# Patient Record
Sex: Male | Born: 1965 | Hispanic: Yes | Marital: Single | Smoking: Former smoker
Health system: Southern US, Community
[De-identification: ages and names within clinical notes are randomized; demographics above are authoritative.]

## PROBLEM LIST (undated history)

## (undated) DIAGNOSIS — I10 Essential (primary) hypertension: Secondary | ICD-10-CM

## (undated) DIAGNOSIS — D689 Coagulation defect, unspecified: Secondary | ICD-10-CM

## (undated) DIAGNOSIS — E785 Hyperlipidemia, unspecified: Secondary | ICD-10-CM

## (undated) HISTORY — DX: Hyperlipidemia, unspecified: E78.5

## (undated) HISTORY — DX: Essential (primary) hypertension: I10

## (undated) HISTORY — PX: GALLBLADDER SURGERY: SHX652

## (undated) HISTORY — DX: Coagulation defect, unspecified: D68.9

---

## 2007-04-11 ENCOUNTER — Emergency Department: Payer: Self-pay | Admitting: Emergency Medicine

## 2018-10-09 ENCOUNTER — Telehealth: Payer: Self-pay | Admitting: Student

## 2018-10-09 ENCOUNTER — Ambulatory Visit
Admission: EM | Admit: 2018-10-09 | Discharge: 2018-10-09 | Disposition: A | Discharge status: Home / Self Care | Payer: 59 | Attending: Family Medicine | Admitting: Family Medicine

## 2018-10-09 DIAGNOSIS — S0502XA Injury of conjunctiva and corneal abrasion without foreign body, left eye, initial encounter: Secondary | ICD-10-CM | POA: Diagnosis not present

## 2018-10-09 MED ORDER — TETRACAINE HCL 0.5 % OP SOLN: OPHTHALMIC | 0 refills | Status: DC

## 2018-10-09 MED ORDER — KETOROLAC TROMETHAMINE 0.5 % OP SOLN: 1.0000 [drp] | Freq: Four times a day (QID) | OPHTHALMIC | 0 refills | Status: DC

## 2018-10-09 MED ORDER — CYCLOPENTOLATE HCL 1 % OP SOLN: 2.0000 [drp] | Freq: Two times a day (BID) | OPHTHALMIC | 0 refills | Status: DC

## 2018-10-09 NOTE — ED Triage Notes (Addendum)
Pt states he was outside yesterday and felt something blow in his eye. Thought it was dirt but did not go away. Unable to open his left eye because of the pain.

## 2018-10-09 NOTE — ED Provider Notes (Signed)
[facility]    CSN: [id number] Arrival date & time: 10/09/18  1549     History   Chief Complaint Chief Complaint  Patient presents with  . Eye Problem    HPI Jesse Chapman is a 53 y.o. male who presents today for evaluation of left eye pain.  The patient states that he was outside yesterday with someone who was smoking and felt like something blew into his left eye.  He states that he does not work with metal at his job.  He denies any previous injury or trauma to the left eye in his past.  No surgical history to the left eye.  He states that he is unable to open his eye due to pain and feels that light is very disruptive for his left eye.  He reports that the pain did start yesterday however it has worsened as the day has gone.  Presents today for further evaluation.  HPI  History reviewed. No pertinent past medical history.  There are no active problems to display for this patient.   Past Surgical History:  Procedure Laterality Date  . GALLBLADDER SURGERY         Home Medications    Prior to Admission medications   Medication Sig Start Date End Date Taking? Authorizing Provider  cyclopentolate (CYCLODRYL,CYCLOGYL) 1 % ophthalmic solution Place 2 drops into the left eye 2 (two) times daily. 10/09/18   Anson Oregon, PA-C  tetracaine (PONTOCAINE) 0.5 % ophthalmic solution Use 1-2 drops in the left eye as needed for pain.  Use only for the next 24 hours. 10/09/18   Anson Oregon, PA-C    Family History Family History  Problem Relation Age of Onset  . Healthy Mother   . Healthy Father     Social History Social History   Tobacco Use  . Smoking status: Former Games developer  . Smokeless tobacco: Never Used  Substance Use Topics  . Alcohol use: Never    Frequency: Never  . Drug use: Not on file     Allergies   Patient has no known allergies.   Review of Systems Review of Systems  Eyes: Positive for photophobia, pain, redness and visual  disturbance.  All other systems reviewed and are negative.  Physical Exam Triage Vital Signs ED Triage Vitals  Enc Vitals Group     BP 10/09/18 1617 (!) 186/98     Pulse Rate 10/09/18 1616 80     Resp 10/09/18 1616 18     Temp 10/09/18 1616 98.4 F (36.9 C)     Temp Source 10/09/18 1616 Oral     SpO2 10/09/18 1616 99 %     Weight 10/09/18 1618 182 lb (82.6 kg)     Height --      Head Circumference --      Peak Flow --      Pain Score 10/09/18 1618 0     Pain Loc --      Pain Edu? --      Excl. in [place]? --    No data found.  Updated Vital Signs BP (!) 186/98   Pulse 80   Temp 98.4 F (36.9 C) (Oral)   Resp 18   Wt 182 lb (82.6 kg)   SpO2 99%   Visual Acuity was unable to be obtained due to photophobia.  Physical Exam Constitutional:      General: He is not in acute distress.    Appearance: He is not  ill-appearing, toxic-appearing or diaphoretic.  HENT:     Head: Normocephalic and atraumatic.  Eyes:     General: Lids are everted, no foreign bodies appreciated.        Left eye: No foreign body, discharge or hordeolum.     Extraocular Movements:     Left eye: Normal extraocular motion.     Conjunctiva/sclera:     Left eye: Left conjunctiva is injected. No exudate or hemorrhage.     Comments: On Woods lamp exam a corneal abrasion is visualized directly over the cornea.  It appears to measure 1 mm in diameter.  No foreign body can be visualized on exam.  Neurological:     Mental Status: He is alert.     UC Treatments / Results  Labs (all labs ordered are listed, but only abnormal results are displayed) Labs Reviewed - No data to display  EKG None  Radiology No results found.  Procedures Procedures (including critical care time)  Medications Ordered in UC Medications - No data to display  Initial Impression / Assessment and Plan / UC Course  I have reviewed the triage vital signs and the nursing notes.  Pertinent labs & imaging results that were  available during my care of the patient were reviewed by me and considered in my medical decision making (see chart for details).     1.  Treatment options were discussed today with the patient. 2.  A prescription of tetracaine was given to the patient to use over the next 24 hours.  After this a prescription for Cyclodryl was provided for long term pain relief.  Ibuprofen as needed for pain.  Encouraged using a patch for the rest of the day today. 3.  Follow-up as needed if pain continues or fails to worsen. Final Clinical Impressions(s) / UC Diagnoses   Final diagnoses:  Abrasion of left cornea, initial encounter     Discharge Instructions     -Use patch for the left eye over the next 24 hours. -I have prescribed two types of eye drops for you, one is Tetracaine which is for pain, use only over the next day.  The other is called Cyclodryl which you can use over the next week as needed for pain. -Follow-up with no improvement.   ED Prescriptions    Medication Sig Dispense Auth. Provider   cyclopentolate (CYCLODRYL,CYCLOGYL) 1 % ophthalmic solution Place 2 drops into the left eye 2 (two) times daily. 15 mL Anson Oregon, PA-C   tetracaine (PONTOCAINE) 0.5 % ophthalmic solution Use 1-2 drops in the left eye as needed for pain.  Use only for the next 24 hours. 5 mL Anson Oregon, PA-C     Controlled Substance Prescriptions [place] Controlled Substance Registry consulted? Not Applicable   Anson Oregon, PA-C 10/09/18 1646

## 2018-10-09 NOTE — Discharge Instructions (Signed)
-  Use patch for the left eye over the next 24 hours. -I have prescribed two types of eye drops for you, one is Tetracaine which is for pain, use only over the next day.  The other is called Cyclodryl which you can use over the next week as needed for pain. -Follow-up with no improvement.

## 2018-10-09 NOTE — Telephone Encounter (Signed)
Sent in Toradol eye drops for pain control for corneal abrasion present to the patient's left eye.  Valeria Batman, PA-C [facility]

## 2019-03-19 ENCOUNTER — Encounter: Payer: Self-pay | Admitting: Emergency Medicine

## 2019-03-19 ENCOUNTER — Ambulatory Visit
Admission: EM | Admit: 2019-03-19 | Discharge: 2019-03-19 | Disposition: A | Discharge status: Home / Self Care | Payer: 59 | Attending: Urgent Care | Admitting: Urgent Care

## 2019-03-19 ENCOUNTER — Other Ambulatory Visit: Payer: Self-pay

## 2019-03-19 DIAGNOSIS — H60502 Unspecified acute noninfective otitis externa, left ear: Secondary | ICD-10-CM

## 2019-03-19 DIAGNOSIS — H6122 Impacted cerumen, left ear: Secondary | ICD-10-CM

## 2019-03-19 DIAGNOSIS — T162XXA Foreign body in left ear, initial encounter: Secondary | ICD-10-CM

## 2019-03-19 MED ORDER — NEOMYCIN-POLYMYXIN-HC 3.5-10000-1 OT SUSP: 4.0000 [drp] | Freq: Three times a day (TID) | OTIC | 0 refills | Status: DC

## 2019-03-19 NOTE — Discharge Instructions (Signed)
It was very nice seeing you today in clinic. Thank you for entrusting me with your care.   Keep ears clean and dry. Avoid using Q-tips. Please utilize the ear drops that we discussed for the next week. Your prescriptions has been called in to your pharmacy.   Make arrangements to follow up with your regular doctor in 1 week for re-evaluation if not improving. If your symptoms/condition worsens, please seek follow up care either here or in the ER. Please remember, our [place] providers are "right here with you" when you need [place].   Again, it was my pleasure to take care of you today. Thank you for choosing our clinic. I hope that you start to feel better quickly.   Honor Loh, MSN, APRN, FNP-C, CEN Advanced Practice Provider [facility]

## 2019-03-19 NOTE — ED Provider Notes (Signed)
[place] [place]   Name: Jesse Chapman DOB: [birth year] MRN: [id number] CSN: [id number] PCP: Patient, No Pcp Per  Arrival date and time:  03/19/19 1536  Chief Complaint:  Otalgia (left)   NOTE: Prior to seeing the patient today, I have reviewed the triage nursing documentation and vital signs. Clinical staff has updated patient's PMH/PSHx, current medication list, and drug allergies/intolerances to ensure comprehensive history available to assist in medical decision making.   History:   HPI: Jesse Chapman is a 53 y.o. male who presents today with complaints of pain in his LEFT ear. Pain began with acute onset last night. Patient has not had any other recent upper respiratory symptoms; no cough, congestion, rhinorrhea, sneezing, or sore throat. He denies forceful nose blowing. Patient reports that he was cleaning his ears out with a cotton tipped swab when the pain began. This morning, he appreciated dried blood on his pillow and external ear. He advises that his ability to hear from the LEFT ear has acutely changed with the onset of the pain; describes hearing as being muffled. Patient denies history of recurrent ear infections. He has never had tympanostomy tubes in the past. Patient advising that he has not been swimming in the recent past.   History reviewed. No pertinent past medical history.  Past Surgical History:  Procedure Laterality Date  . GALLBLADDER SURGERY      Family History  Problem Relation Age of Onset  . Healthy Mother   . Healthy Father     Social History   Tobacco Use  . Smoking status: Former Games developer  . Smokeless tobacco: Never Used  Substance Use Topics  . Alcohol use: Never    Frequency: Never  . Drug use: Not on file    There are no active problems to display for this patient.   Home Medications:    No outpatient medications have been marked as taking for the 03/19/19 encounter [facility] Encounter).    Allergies:   Patient has no  known allergies.  Review of Systems (ROS): Review of Systems  Constitutional: Negative for chills and fever.  HENT: Positive for ear discharge and ear pain. Negative for congestion, facial swelling, rhinorrhea, sinus pressure, sinus pain and sore throat.   Respiratory: Negative for cough and shortness of breath.   Cardiovascular: Negative for chest pain and palpitations.  Neurological: Negative for dizziness and headaches.  All other systems reviewed and are negative.    Vital Signs: Today's Vitals   03/19/19 1546 03/19/19 1549 03/19/19 1632  BP:  (!) 166/95   Pulse:  85   Resp:  16   Temp:  98.1 F (36.7 C)   TempSrc:  Oral   SpO2:  98%   Weight: 178 lb (80.7 kg)    PainSc: 6   6     Physical Exam: Physical Exam  Constitutional: He is oriented to person, place, and time and well-developed, well-nourished, and in no distress.  HENT:  Head: Normocephalic and atraumatic.  Right Ear: Hearing, tympanic membrane, external ear and ear canal normal.  Left Ear: There is drainage (sanguinous) and tenderness. Decreased hearing (muffled) is noted.  Mouth/Throat: Mucous membranes are normal.  LEFT ear reveals a EAC that is filled with impacted cerumen and portions of retained cotton. TM obscured (pre-procedural).   Eyes: Pupils are equal, round, and reactive to light. EOM are normal.  Neck: Normal range of motion. Neck supple. No tracheal deviation present.  Cardiovascular: Normal rate, regular rhythm, normal heart  sounds and intact distal pulses. Exam reveals no gallop and no friction rub.  No murmur heard. Pulmonary/Chest: Effort normal and breath sounds normal. No respiratory distress. He has no wheezes. He has no rales.  Neurological: He is alert and oriented to person, place, and time. Gait normal.  Skin: Skin is warm and dry. No rash noted.  Psychiatric: Mood, memory, affect and judgment normal.  Nursing note and vitals reviewed.   Urgent Care Treatments / Results:    LABS: PLEASE NOTE: all labs that were ordered this encounter are listed, however only abnormal results are displayed. Labs Reviewed - No data to display  EKG: -None  RADIOLOGY: No results found.  PROCEDURES: Ear Cerumen Removal Performed by: Karen Kitchens, NP Authorized by: Karen Kitchens, NP   Consent:    Consent obtained:  Verbal   Consent given by:  Patient   Risks discussed:  Bleeding, dizziness, infection, incomplete removal, pain and TM perforation   Alternatives discussed:  Delayed treatment, alternative treatment and referral Procedure details:    Location:  L ear   Procedure type comment:  Warm water lavage followed by curette disimpaction Post-procedure details:    Inspection:  Bleeding, macerated skin and TM intact   Hearing quality:  Improved   Patient tolerance of procedure:  Tolerated well, no immediate complications   MEDICATIONS RECEIVED THIS VISIT: Medications - No data to display  PERTINENT CLINICAL COURSE NOTES/UPDATES:   Initial Impression / Assessment and Plan / Urgent Care Course:  Pertinent labs & imaging results that were available during my care of the patient were personally reviewed by me and considered in my medical decision making (see lab/imaging section of note for values and interpretations).  Jesse Chapman is a 53 y.o. male who presents to [facility] today with complaints of Otalgia (left)   Patient is well appearing overall in clinic today. He does not appear to be in any acute distress. Presenting symptoms (see HPI) and exam as documented above. LEFT ear reveals EAC totally occluded by cerumen and portions of cotton. Copious amounts of cerumen removed. Large wad of cotton also removed. Patient does not recall cotton being left in ear. Post-procedural inspection reveals significantly macerated EAC with a fair amount of bleeding. TM intact. Will cover with a 5 day course of Cortisporin gtts. May use Tylenol and/or Ibuprofen  as needed for discomfort. Discussed routine ear care, including the need to avoid the use of cotton tipped swabs.   Discussed follow up with primary care physician in 1 week for re-evaluation. I have reviewed the follow up and strict return precautions for any new or worsening symptoms. Patient is aware of symptoms that would be deemed urgent/emergent, and would thus require further evaluation either here or in the emergency department. At the time of discharge, he verbalized understanding and consent with the discharge plan as it was reviewed with him. All questions were fielded by provider and/or clinic staff prior to patient discharge.    Final Clinical Impressions / Urgent Care Diagnoses:   Final diagnoses:  Acute otitis externa of left ear, unspecified type  Impacted cerumen of left ear  FB ear, left, initial encounter    New Prescriptions:  [place] Controlled Substance Registry consulted? Not Applicable  Meds ordered this encounter  Medications  . neomycin-polymyxin-hydrocortisone (CORTISPORIN) 3.5-10000-1 OTIC suspension    Sig: Place 4 drops into the left ear 3 (three) times daily. for the next 5 days    Dispense:  10 mL  Refill:  0    Recommended Follow up Care:  Patient encouraged to follow up with the following provider within the specified time frame, or sooner as dictated by the severity of his symptoms. As always, he was instructed that for any urgent/emergent care needs, he should seek care either here or in the emergency department for more immediate evaluation.  Follow-up Information    PCP In 1 week.   Why: General reassessment of symptoms if not improving        NOTE: This note was prepared using Scientist, clinical (histocompatibility and immunogenetics)Dragon dictation software along with smaller Lobbyistphrase technology. Despite my best ability to proofread, there is the potential that transcriptional errors may still occur from this process, and are completely unintentional.     Verlee MonteGray, Lamine Laton E, NP 03/20/19 0015

## 2019-03-19 NOTE — ED Triage Notes (Signed)
Patient c/o left ear pain that started last night. Patient report some blood coming from his left ear this morning. Patient denies fevers.

## 2019-08-31 ENCOUNTER — Emergency Department
Admission: EM | Admit: 2019-08-31 | Discharge: 2019-08-31 | Disposition: A | Discharge status: Home / Self Care | Payer: 59 | Attending: Emergency Medicine | Admitting: Emergency Medicine

## 2019-08-31 ENCOUNTER — Encounter: Payer: Self-pay | Admitting: Emergency Medicine

## 2019-08-31 ENCOUNTER — Other Ambulatory Visit: Payer: Self-pay

## 2019-08-31 ENCOUNTER — Emergency Department: Admission: EM | Admit: 2019-08-31 | Discharge: 2019-08-31 | Discharge status: Absent without leave | Payer: Self-pay

## 2019-08-31 ENCOUNTER — Emergency Department: Payer: 59

## 2019-08-31 DIAGNOSIS — Z87891 Personal history of nicotine dependence: Secondary | ICD-10-CM | POA: Diagnosis not present

## 2019-08-31 DIAGNOSIS — R9431 Abnormal electrocardiogram [ECG] [EKG]: Secondary | ICD-10-CM | POA: Insufficient documentation

## 2019-08-31 LAB — BASIC METABOLIC PANEL
Anion gap: 6 (ref 5–15)
BUN: 18 mg/dL (ref 6–20)
CO2: 29 mmol/L (ref 22–32)
Calcium: 9.1 mg/dL (ref 8.9–10.3)
Chloride: 102 mmol/L (ref 98–111)
Creatinine, Ser: 1.04 mg/dL (ref 0.61–1.24)
GFR calc Af Amer: >60 mL/min (ref >60)
GFR calc non Af Amer: >60 mL/min (ref >60)
Glucose, Bld: 116 mg/dL — ABNORMAL HIGH (ref 70–99)
Potassium: 3.9 mmol/L (ref 3.5–5.1)
Sodium: 137 mmol/L (ref 135–145)

## 2019-08-31 LAB — CBC
HCT: 39.8 % (ref 39.0–52.0)
Hemoglobin: 14.1 g/dL (ref 13.0–17.0)
MCH: 30.1 pg (ref 26.0–34.0)
MCHC: 35.4 g/dL (ref 30.0–36.0)
MCV: 85 fL (ref 80.0–100.0)
Platelets: 395 10*3/uL (ref 150–400)
RBC: 4.68 MIL/uL (ref 4.22–5.81)
RDW: 11.4 % — ABNORMAL LOW (ref 11.5–15.5)
WBC: 14.3 10*3/uL — ABNORMAL HIGH (ref 4.0–10.5)
nRBC: 0 % (ref 0.0–0.2)

## 2019-08-31 LAB — TROPONIN I (HIGH SENSITIVITY): Troponin I (High Sensitivity): 6 ng/L (ref <18)

## 2019-08-31 NOTE — ED Provider Notes (Signed)
[facility] Emergency Department Provider Note  Time seen: 9:56 PM  I have reviewed the triage vital signs and the nursing notes.   HISTORY  Chief Complaint Abnormal EKG   HPI Jesse Chapman is a 54 y.o. male with no significant past medical history presents to the emergency department for an abnormal EKG.  According to the patient around 1230 this afternoon he took a marijuana gummy, he states approximately 20 to 30 minutes later he began feeling very unwell which she describes as nausea tingling all over his body and shaking.  Patient states he ultimately went to the walk-in clinic was given a little pink pill  and states 10 minutes after taking the pink pill he felt completely back to normal.  They told him however his EKG was abnormal and he should go to the emergency department.  Patient denies any chest pain or shortness of breath at any point.  Patient states he feels normal.  Largely negative review of systems.  History reviewed. No pertinent past medical history.  There are no problems to display for this patient.   Past Surgical History:  Procedure Laterality Date  . GALLBLADDER SURGERY      Prior to Admission medications   Medication Sig Start Date End Date Taking? Authorizing Provider  neomycin-polymyxin-hydrocortisone (CORTISPORIN) 3.5-10000-1 OTIC suspension Place 4 drops into the left ear 3 (three) times daily. for the next 5 days 03/19/19   Karen Kitchens, NP    No Known Allergies  Family History  Problem Relation Age of Onset  . Healthy Mother   . Healthy Father     Social History Social History   Tobacco Use  . Smoking status: Former Research scientist (life sciences)  . Smokeless tobacco: Never Used  Substance Use Topics  . Alcohol use: Never  . Drug use: Never    Review of Systems Constitutional: Negative for fever. Cardiovascular: Negative for chest pain. Respiratory: Negative for shortness of breath. Gastrointestinal: Negative for abdominal  pain Musculoskeletal: Negative for musculoskeletal complaints Neurological: Negative for headache All other ROS negative  ____________________________________________   PHYSICAL EXAM:  VITAL SIGNS: ED Triage Vitals  Enc Vitals Group     BP 08/31/19 1854 (!) 143/88     Pulse Rate 08/31/19 1854 90     Resp 08/31/19 1854 16     Temp 08/31/19 1854 98.4 F (36.9 C)     Temp Source 08/31/19 1854 Oral     SpO2 08/31/19 1854 97 %     Weight 08/31/19 1836 180 lb (81.6 kg)     Height 08/31/19 1836 5\' 4"  (1.626 m)     Head Circumference --      Peak Flow --      Pain Score 08/31/19 1836 0     Pain Loc --      Pain Edu? --      Excl. in [place]? --    Constitutional: Alert and oriented. Well appearing and in no distress. Eyes: Normal exam ENT      Head: Normocephalic and atraumatic.      Mouth/Throat: Mucous membranes are moist. Cardiovascular: Normal rate, regular rhythm.  Respiratory: Normal respiratory effort without tachypnea nor retractions. Breath sounds are clear Gastrointestinal: Soft and nontender. No distention. Musculoskeletal: Nontender with normal range of motion in all extremities.  Neurologic:  Normal speech and language. No gross focal neurologic deficits  Skin:  Skin is warm, dry and intact.  Psychiatric: Mood and affect are normal.  ____________________________________________  EKG  EKG viewed and interpreted by myself shows a normal sinus rhythm at 95 bpm with a narrow QRS, normal axis, normal intervals.  Patient does have inferior T wave inversions.  No old EKG for comparison. ____________________________________________    RADIOLOGY  Chest x-ray negative  ____________________________________________   INITIAL IMPRESSION / ASSESSMENT AND PLAN / ED COURSE  Pertinent labs & imaging results that were available during my care of the patient were reviewed by me and considered in my medical decision making (see chart for details).   Patient presents to the  emergency department referred from walk-in clinic for an abnormal EKG.  According to the patient he ate a marijuana gummy around 12:30 PM.  20 to 30 minutes later he began feeling tingling all over his body shaky and weak.  Patient went to the walk-in clinic was given a "little pink pill" and felt much better 10 to 15 minutes later.  Patient denies any symptoms at this time but was told he needed to come here due to an abnormal EKG per patient.  Patient's EKG is abnormal with inferior T wave inversions.  No ST elevation.  Patient has no chest symptoms no pain or shortness of breath.  Patient is troponin is reassuringly negative.  No old EKG for comparison.  However as the patient feels normal has no symptoms at this time with a reassuring work-up and physical exam I believe the patient is safe for discharge home with outpatient cardiology follow-up.  Patient agreeable to this plan as well.  Discussed my normal chest pain return precautions.  Jesse Chapman was evaluated in Emergency Department on 08/31/2019 for the symptoms described in the history of present illness. He was evaluated in the context of the global COVID-19 pandemic, which necessitated consideration that the patient might be at risk for infection with the SARS-CoV-2 virus that causes COVID-19. Institutional protocols and algorithms that pertain to the evaluation of patients at risk for COVID-19 are in a state of rapid change based on information released by regulatory bodies including the CDC and federal and state organizations. These policies and algorithms were followed during the patient's care in the ED.  ____________________________________________   FINAL CLINICAL IMPRESSION(S) / ED DIAGNOSES  Abnormal EKG   Minna Antis, MD 08/31/19 2201

## 2019-08-31 NOTE — ED Triage Notes (Signed)
Pt in via POV, sent over from [facility] due to abnormal EKG.  Pt denies any chest pain, reports being at work and "feeling as if I had been drugged, I had a headache and felt a tingling sensation all over my body."  Symptoms have resolved at this time.

## 2019-08-31 NOTE — Discharge Instructions (Addendum)
Please drink plenty of fluids and obtain plenty of rest.  Please follow-up with cardiology by calling the number provided regarding your abnormal EKG.  Return to the emergency department for any chest pain, trouble breathing, or any other symptom personally concerning to yourself.

## 2020-05-17 ENCOUNTER — Other Ambulatory Visit: Payer: Self-pay

## 2020-05-17 ENCOUNTER — Ambulatory Visit
Admission: EM | Admit: 2020-05-17 | Discharge: 2020-05-17 | Disposition: A | Discharge status: Home / Self Care | Payer: 59

## 2020-05-17 DIAGNOSIS — H5712 Ocular pain, left eye: Secondary | ICD-10-CM

## 2020-05-17 NOTE — ED Provider Notes (Signed)
[facility]    CSN: [id number] Arrival date & time: 05/17/20  8101      History   Chief Complaint Chief Complaint  Patient presents with  . Eye Pain    left    HPI Jesse Chapman is a 54 y.o. male.   -year-old male here for evaluation of left eye pain, drainage, and light sensitivity.  Patient reports that he was fine when he went to bed last night but when he woke up this morning he had some discharge, felt like there was a foreign body in his eye, had some tearing.  Patient denies itching, headache, nausea, or known injury.  Patient denies that he was working with any wood or metal grinder's.  Patient reports that his vision was clear yesterday but is completely blurry in that eye today.     History reviewed. No pertinent past medical history.  There are no problems to display for this patient.   Past Surgical History:  Procedure Laterality Date  . GALLBLADDER SURGERY         Home Medications    Prior to Admission medications   Not on File    Family History Family History  Problem Relation Age of Onset  . Healthy Mother   . Healthy Father     Social History Social History   Tobacco Use  . Smoking status: Former Games developer  . Smokeless tobacco: Never Used  Vaping Use  . Vaping Use: Former  Substance Use Topics  . Alcohol use: Never  . Drug use: Never     Allergies   Patient has no known allergies.   Review of Systems Review of Systems  Constitutional: Negative for activity change and appetite change.  HENT: Negative for rhinorrhea and sore throat.   Eyes: Positive for photophobia, pain, discharge and visual disturbance. Negative for redness and itching.  Respiratory: Negative for cough and shortness of breath.   Cardiovascular: Negative for chest pain.  Gastrointestinal: Negative for diarrhea, nausea and vomiting.  Musculoskeletal: Negative for arthralgias and myalgias.  Skin: Negative for rash.  Neurological: Negative  for syncope and headaches.  Hematological: Negative.   Psychiatric/Behavioral: Negative.      Physical Exam Triage Vital Signs ED Triage Vitals  Enc Vitals Group     BP 05/17/20 1000 (!) 156/93     Pulse Rate 05/17/20 1000 67     Resp 05/17/20 1000 18     Temp 05/17/20 1000 98.1 F (36.7 C)     Temp Source 05/17/20 1000 Oral     SpO2 05/17/20 1000 100 %     Weight 05/17/20 0958 180 lb (81.6 kg)     Height 05/17/20 0958 5\' 4"  (1.626 m)     Head Circumference --      Peak Flow --      Pain Score 05/17/20 0958 2     Pain Loc --      Pain Edu? --      Excl. in GC? --    No data found.  Updated Vital Signs BP (!) 156/93 (BP Location: Right Arm)   Pulse 67   Temp 98.1 F (36.7 C) (Oral)   Resp 18   Ht 5\' 4"  (1.626 m)   Wt 180 lb (81.6 kg)   SpO2 100%   BMI 30.90 kg/m   Visual Acuity Right Eye Distance:   Left Eye Distance:   Bilateral Distance:    Right Eye Near:   Left Eye Near:  Bilateral Near:     Physical Exam Vitals and nursing note reviewed.  Constitutional:      General: He is in acute distress.     Appearance: Normal appearance.     Comments: Patient appears to be in pain.  HENT:     Head: Normocephalic and atraumatic.  Eyes:     General: No scleral icterus.       Left eye: No discharge.     Extraocular Movements: Extraocular movements intact.     Conjunctiva/sclera: Conjunctivae normal.     Pupils: Pupils are equal, round, and reactive to light.     Comments: Patient has mild swelling to the upper and lower eyelid of the left eye.  There is no erythema or induration noted.  No discharge on the lashes or in either inner or outer canthus.  Patient's pupil is 4 mm and constrict to 2 mm with direct light.  Patient has marked photophobia.  EOM is intact.  2 drops of tetracaine instilled into the left eye followed by fluorescein dye.  I examined under Woods lamp with no evidence of corneal abrasion or foreign body.  Upper and lower eyelid inverted and  no foreign body was visualized.  Cardiovascular:     Rate and Rhythm: Normal rate and regular rhythm.     Pulses: Normal pulses.     Heart sounds: Normal heart sounds. No murmur heard.  No gallop.   Pulmonary:     Effort: Pulmonary effort is normal.     Breath sounds: Normal breath sounds. No wheezing, rhonchi or rales.  Musculoskeletal:        General: No swelling or tenderness. Normal range of motion.     Cervical back: Normal range of motion and neck supple. No tenderness.  Skin:    General: Skin is warm and dry.     Capillary Refill: Capillary refill takes less than 2 seconds.     Findings: Erythema present. No rash.  Neurological:     General: No focal deficit present.     Mental Status: He is alert and oriented to person, place, and time.     Sensory: No sensory deficit.     Motor: No weakness.  Psychiatric:        Mood and Affect: Mood normal.        Behavior: Behavior normal.        Thought Content: Thought content normal.        Judgment: Judgment normal.      UC Treatments / Results  Labs (all labs ordered are listed, but only abnormal results are displayed) Labs Reviewed - No data to display  EKG   Radiology No results found.  Procedures Procedures (including critical care time)  Medications Ordered in UC Medications - No data to display  Initial Impression / Assessment and Plan / UC Course  I have reviewed the triage vital signs and the nursing notes.  Pertinent labs & imaging results that were available during my care of the patient were reviewed by me and considered in my medical decision making (see chart for details).   Patient is complaining of foreign body sensation and pain in his left eye with associated light sensitivity.  Patient reports that he had some drainage on his lashes this morning but denies any itching.  Bulbar and labral conjunctiva do not show significant injection.  There is no evidence of foreign body or corneal abrasion under  Woods lamp examination.  No foreign body located within eversion  of upper or lower eyelid.  Patient is seen by [facility] in [place].  Called and spoke with the office regarding their ability to see the patient today and I am awaiting a call back.  [facility] can see patient in the office today at 1:40 PM.  Will patch patient's left eye and DC him to follow-up with ophthalmology.  Final Clinical Impressions(s) / UC Diagnoses   Final diagnoses:  Left eye pain     Discharge Instructions     Wear the patch on your left eye until you are evaluated by ophthalmology.  [facility] can see you this afternoon at 1:40 PM.    ED Prescriptions    None     PDMP not reviewed this encounter.   Becky Augusta, NP 05/17/20 1048

## 2020-05-17 NOTE — ED Notes (Signed)
Placed 4x4 over eye with tape applied to keep eye closed til he follows up with [facility].

## 2020-05-17 NOTE — ED Triage Notes (Signed)
Patient states that he has been having left eye pain since this morning. States that he feels like something is in his and is unable to open fully. Patient states that something feels like it is poking him. Sensitive to light and draining some.

## 2020-05-17 NOTE — Discharge Instructions (Addendum)
Wear the patch on your left eye until you are evaluated by ophthalmology.  [facility] can see you this afternoon at 1:40 PM.

## 2020-07-25 ENCOUNTER — Ambulatory Visit
Admission: EM | Admit: 2020-07-25 | Discharge: 2020-07-25 | Disposition: A | Discharge status: Home / Self Care | Payer: 59 | Attending: Family Medicine | Admitting: Family Medicine

## 2020-07-25 ENCOUNTER — Encounter: Payer: Self-pay | Admitting: Radiology

## 2020-07-25 ENCOUNTER — Inpatient Hospital Stay
Admission: EM | Admit: 2020-07-25 | Discharge: 2020-07-28 | DRG: 253 | Disposition: A | Discharge status: Home / Self Care | Payer: 59 | Attending: Internal Medicine | Admitting: Internal Medicine

## 2020-07-25 ENCOUNTER — Other Ambulatory Visit (INDEPENDENT_AMBULATORY_CARE_PROVIDER_SITE_OTHER): Payer: Self-pay | Admitting: Vascular Surgery

## 2020-07-25 ENCOUNTER — Other Ambulatory Visit: Payer: Self-pay

## 2020-07-25 ENCOUNTER — Emergency Department: Payer: 59

## 2020-07-25 ENCOUNTER — Encounter
Admission: EM | Disposition: A | Discharge status: Home / Self Care | Payer: Self-pay | Source: Home / Self Care | Attending: Internal Medicine

## 2020-07-25 DIAGNOSIS — R93421 Abnormal radiologic findings on diagnostic imaging of right kidney: Secondary | ICD-10-CM | POA: Diagnosis present

## 2020-07-25 DIAGNOSIS — R231 Pallor: Secondary | ICD-10-CM | POA: Diagnosis not present

## 2020-07-25 DIAGNOSIS — I742 Embolism and thrombosis of arteries of the upper extremities: Principal | ICD-10-CM | POA: Diagnosis present

## 2020-07-25 DIAGNOSIS — Z20822 Contact with and (suspected) exposure to covid-19: Secondary | ICD-10-CM | POA: Diagnosis present

## 2020-07-25 DIAGNOSIS — R100 Acute abdomen: Secondary | ICD-10-CM | POA: Diagnosis present

## 2020-07-25 DIAGNOSIS — Z821 Family history of blindness and visual loss: Secondary | ICD-10-CM

## 2020-07-25 DIAGNOSIS — Z23 Encounter for immunization: Secondary | ICD-10-CM

## 2020-07-25 DIAGNOSIS — I998 Other disorder of circulatory system: Secondary | ICD-10-CM

## 2020-07-25 DIAGNOSIS — Z87891 Personal history of nicotine dependence: Secondary | ICD-10-CM

## 2020-07-25 DIAGNOSIS — Z6837 Body mass index (BMI) 37.0-37.9, adult: Secondary | ICD-10-CM | POA: Diagnosis not present

## 2020-07-25 DIAGNOSIS — M79601 Pain in right arm: Secondary | ICD-10-CM | POA: Diagnosis not present

## 2020-07-25 DIAGNOSIS — K56609 Unspecified intestinal obstruction, unspecified as to partial versus complete obstruction: Secondary | ICD-10-CM | POA: Diagnosis not present

## 2020-07-25 DIAGNOSIS — Z818 Family history of other mental and behavioral disorders: Secondary | ICD-10-CM

## 2020-07-25 DIAGNOSIS — K56601 Complete intestinal obstruction, unspecified as to cause: Secondary | ICD-10-CM | POA: Diagnosis not present

## 2020-07-25 DIAGNOSIS — I7090 Unspecified atherosclerosis: Secondary | ICD-10-CM

## 2020-07-25 DIAGNOSIS — Z8249 Family history of ischemic heart disease and other diseases of the circulatory system: Secondary | ICD-10-CM | POA: Diagnosis not present

## 2020-07-25 DIAGNOSIS — D72829 Elevated white blood cell count, unspecified: Secondary | ICD-10-CM | POA: Diagnosis not present

## 2020-07-25 DIAGNOSIS — R202 Paresthesia of skin: Secondary | ICD-10-CM | POA: Diagnosis not present

## 2020-07-25 DIAGNOSIS — Z825 Family history of asthma and other chronic lower respiratory diseases: Secondary | ICD-10-CM

## 2020-07-25 DIAGNOSIS — R200 Anesthesia of skin: Secondary | ICD-10-CM | POA: Diagnosis present

## 2020-07-25 DIAGNOSIS — I10 Essential (primary) hypertension: Secondary | ICD-10-CM | POA: Diagnosis present

## 2020-07-25 DIAGNOSIS — E669 Obesity, unspecified: Secondary | ICD-10-CM | POA: Diagnosis present

## 2020-07-25 HISTORY — PX: UPPER EXTREMITY ANGIOGRAPHY: CATH118270

## 2020-07-25 LAB — RESP PANEL BY RT-PCR (FLU A&B, COVID) ARPGX2
Influenza A by PCR: NEGATIVE
Influenza B by PCR: NEGATIVE
SARS Coronavirus 2 by RT PCR: NEGATIVE

## 2020-07-25 LAB — CBC WITH DIFFERENTIAL/PLATELET
Abs Immature Granulocytes: 0.09 10*3/uL — ABNORMAL HIGH (ref 0.00–0.07)
Basophils Absolute: 0 10*3/uL (ref 0.0–0.1)
Basophils Relative: 0 %
Eosinophils Absolute: 0 10*3/uL (ref 0.0–0.5)
Eosinophils Relative: 0 %
HCT: 44.5 % (ref 39.0–52.0)
Hemoglobin: 15.7 g/dL (ref 13.0–17.0)
Immature Granulocytes: 1 %
Lymphocytes Relative: 6 %
Lymphs Abs: 1.1 10*3/uL (ref 0.7–4.0)
MCH: 30.3 pg (ref 26.0–34.0)
MCHC: 35.3 g/dL (ref 30.0–36.0)
MCV: 85.7 fL (ref 80.0–100.0)
Monocytes Absolute: 0.5 10*3/uL (ref 0.1–1.0)
Monocytes Relative: 3 %
Neutro Abs: 16.4 10*3/uL — ABNORMAL HIGH (ref 1.7–7.7)
Neutrophils Relative %: 90 %
Platelets: 363 10*3/uL (ref 150–400)
RBC: 5.19 MIL/uL (ref 4.22–5.81)
RDW: 11.5 % (ref 11.5–15.5)
WBC: 18.1 10*3/uL — ABNORMAL HIGH (ref 4.0–10.5)
nRBC: 0 % (ref 0.0–0.2)

## 2020-07-25 LAB — COMPREHENSIVE METABOLIC PANEL
ALT: 41 U/L (ref 0–44)
AST: 29 U/L (ref 15–41)
Albumin: 4.5 g/dL (ref 3.5–5.0)
Alkaline Phosphatase: 58 U/L (ref 38–126)
Anion gap: 14 (ref 5–15)
BUN: 23 mg/dL — ABNORMAL HIGH (ref 6–20)
CO2: 24 mmol/L (ref 22–32)
Calcium: 9.4 mg/dL (ref 8.9–10.3)
Chloride: 101 mmol/L (ref 98–111)
Creatinine, Ser: 1.07 mg/dL (ref 0.61–1.24)
GFR, Estimated: >60 mL/min (ref >60)
Glucose, Bld: 162 mg/dL — ABNORMAL HIGH (ref 70–99)
Potassium: 4 mmol/L (ref 3.5–5.1)
Sodium: 139 mmol/L (ref 135–145)
Total Bilirubin: 1 mg/dL (ref 0.3–1.2)
Total Protein: 7.7 g/dL (ref 6.5–8.1)

## 2020-07-25 LAB — LACTIC ACID, PLASMA: Lactic Acid, Venous: 1.9 mmol/L (ref 0.5–1.9)

## 2020-07-25 LAB — HEPARIN LEVEL (UNFRACTIONATED): Heparin Unfractionated: 0.67 IU/mL (ref 0.30–0.70)

## 2020-07-25 LAB — TYPE AND SCREEN

## 2020-07-25 LAB — HIV ANTIBODY (ROUTINE TESTING W REFLEX): HIV Screen 4th Generation wRfx: NONREACTIVE

## 2020-07-25 LAB — APTT: aPTT: 25 seconds (ref 24–36)

## 2020-07-25 SURGERY — UPPER EXTREMITY ANGIOGRAPHY
Anesthesia: Moderate Sedation | Laterality: Right

## 2020-07-25 MED ORDER — LABETALOL HCL 5 MG/ML IV SOLN
INTRAVENOUS | Status: AC
  Filled 2020-07-25: qty 4

## 2020-07-25 MED ORDER — SODIUM CHLORIDE 0.9 % IV SOLN: 250.0000 mL | INTRAVENOUS | Status: DC | PRN

## 2020-07-25 MED ORDER — CEFAZOLIN SODIUM-DEXTROSE 2-4 GM/100ML-% IV SOLN: 2.0000 g | Freq: Once | INTRAVENOUS | Status: DC

## 2020-07-25 MED ORDER — LACTATED RINGERS IV BOLUS
1000.0000 mL | Freq: Once | INTRAVENOUS | Status: AC
  Administered 2020-07-25: 1000 mL via INTRAVENOUS

## 2020-07-25 MED ORDER — ONDANSETRON HCL 4 MG/2ML IJ SOLN: 4.0000 mg | Freq: Four times a day (QID) | INTRAMUSCULAR | Status: DC | PRN

## 2020-07-25 MED ORDER — HEPARIN (PORCINE) 25000 UT/250ML-% IV SOLN
1400.0000 [IU]/h | INTRAVENOUS | Status: DC
  Administered 2020-07-25 – 2020-07-26 (×3): 1400 [IU]/h via INTRAVENOUS
  Filled 2020-07-25 (×3): qty 250

## 2020-07-25 MED ORDER — MORPHINE SULFATE (PF) 2 MG/ML IV SOLN
INTRAVENOUS | Status: AC
  Filled 2020-07-25: qty 1

## 2020-07-25 MED ORDER — SODIUM CHLORIDE 0.9 % IV SOLN: INTRAVENOUS | Status: AC

## 2020-07-25 MED ORDER — HYDROMORPHONE HCL 1 MG/ML IJ SOLN
1.0000 mg | Freq: Once | INTRAMUSCULAR | Status: AC | PRN
  Administered 2020-07-25: 1 mg via INTRAVENOUS
  Filled 2020-07-25: qty 1

## 2020-07-25 MED ORDER — SODIUM CHLORIDE 0.9 % IV SOLN: Freq: Once | INTRAVENOUS | Status: DC

## 2020-07-25 MED ORDER — ACETAMINOPHEN 325 MG PO TABS: 650.0000 mg | ORAL_TABLET | Freq: Four times a day (QID) | ORAL | Status: DC | PRN

## 2020-07-25 MED ORDER — FENTANYL CITRATE (PF) 100 MCG/2ML IJ SOLN
INTRAMUSCULAR | Status: AC
  Filled 2020-07-25: qty 2

## 2020-07-25 MED ORDER — FENTANYL CITRATE (PF) 100 MCG/2ML IJ SOLN: 12.5000 ug | INTRAMUSCULAR | Status: DC | PRN

## 2020-07-25 MED ORDER — IODIXANOL 320 MG/ML IV SOLN
INTRAVENOUS | Status: DC | PRN
  Administered 2020-07-25: 75 mL

## 2020-07-25 MED ORDER — FENTANYL CITRATE (PF) 100 MCG/2ML IJ SOLN
INTRAMUSCULAR | Status: AC
  Administered 2020-07-25: 50 ug
  Filled 2020-07-25: qty 2

## 2020-07-25 MED ORDER — ATORVASTATIN CALCIUM 10 MG PO TABS
10.0000 mg | ORAL_TABLET | Freq: Every day | ORAL | Status: DC
  Administered 2020-07-27: 10 mg via ORAL
  Filled 2020-07-25 (×3): qty 1

## 2020-07-25 MED ORDER — MIDAZOLAM HCL 5 MG/5ML IJ SOLN
INTRAMUSCULAR | Status: AC
  Administered 2020-07-25: 2 mg
  Filled 2020-07-25: qty 5

## 2020-07-25 MED ORDER — BUTAMBEN-TETRACAINE-BENZOCAINE 2-2-14 % EX AERO: 1.0000 | INHALATION_SPRAY | Freq: Two times a day (BID) | CUTANEOUS | Status: DC | PRN

## 2020-07-25 MED ORDER — FAMOTIDINE 20 MG PO TABS: 40.0000 mg | ORAL_TABLET | Freq: Once | ORAL | Status: DC | PRN

## 2020-07-25 MED ORDER — ONDANSETRON HCL 4 MG/2ML IJ SOLN
4.0000 mg | Freq: Once | INTRAMUSCULAR | Status: AC
  Administered 2020-07-25: 4 mg via INTRAVENOUS
  Filled 2020-07-25: qty 2

## 2020-07-25 MED ORDER — INFLUENZA VAC SPLIT QUAD 0.5 ML IM SUSY
0.5000 mL | PREFILLED_SYRINGE | INTRAMUSCULAR | Status: AC | PRN
  Administered 2020-07-28: 0.5 mL via INTRAMUSCULAR
  Filled 2020-07-25: qty 0.5

## 2020-07-25 MED ORDER — MORPHINE SULFATE (PF) 4 MG/ML IV SOLN
2.0000 mg | INTRAVENOUS | Status: DC | PRN
  Administered 2020-07-25: 2 mg via INTRAVENOUS

## 2020-07-25 MED ORDER — HEPARIN SODIUM (PORCINE) 1000 UNIT/ML IJ SOLN
INTRAMUSCULAR | Status: AC
  Administered 2020-07-25: 3000 [IU] via INTRAVENOUS
  Filled 2020-07-25: qty 1

## 2020-07-25 MED ORDER — FENTANYL CITRATE (PF) 100 MCG/2ML IJ SOLN
INTRAMUSCULAR | Status: DC | PRN
  Administered 2020-07-25 (×3): 25 ug via INTRAVENOUS

## 2020-07-25 MED ORDER — METHYLPREDNISOLONE SODIUM SUCC 125 MG IJ SOLR: 125.0000 mg | Freq: Once | INTRAMUSCULAR | Status: DC | PRN

## 2020-07-25 MED ORDER — ACETAMINOPHEN 650 MG RE SUPP: 650.0000 mg | Freq: Four times a day (QID) | RECTAL | Status: DC | PRN

## 2020-07-25 MED ORDER — ONDANSETRON HCL 4 MG PO TABS: 4.0000 mg | ORAL_TABLET | Freq: Four times a day (QID) | ORAL | Status: DC | PRN

## 2020-07-25 MED ORDER — BUTAMBEN-TETRACAINE-BENZOCAINE 2-2-14 % EX AERO
INHALATION_SPRAY | CUTANEOUS | Status: AC
  Administered 2020-07-25: 1 via TOPICAL
  Filled 2020-07-25: qty 5

## 2020-07-25 MED ORDER — MIDAZOLAM HCL 2 MG/2ML IJ SOLN
INTRAMUSCULAR | Status: DC | PRN
  Administered 2020-07-25 (×2): 0.5 mg via INTRAVENOUS
  Administered 2020-07-25: 1 mg via INTRAVENOUS

## 2020-07-25 MED ORDER — NITROGLYCERIN 1 MG/10 ML FOR IR/CATH LAB
INTRA_ARTERIAL | Status: DC | PRN
  Administered 2020-07-25: 300 ug via INTRA_ARTERIAL

## 2020-07-25 MED ORDER — CEFAZOLIN SODIUM-DEXTROSE 2-4 GM/100ML-% IV SOLN
INTRAVENOUS | Status: AC
  Administered 2020-07-25: 2 g
  Filled 2020-07-25: qty 100

## 2020-07-25 MED ORDER — HYDROMORPHONE HCL 1 MG/ML IJ SOLN
0.5000 mg | INTRAMUSCULAR | Status: DC | PRN
  Administered 2020-07-25 (×2): 0.5 mg via INTRAVENOUS
  Filled 2020-07-25: qty 1

## 2020-07-25 MED ORDER — HEPARIN BOLUS VIA INFUSION
5500.0000 [IU] | Freq: Once | INTRAVENOUS | Status: AC
  Administered 2020-07-25: 5500 [IU] via INTRAVENOUS
  Filled 2020-07-25: qty 5500

## 2020-07-25 MED ORDER — MIDAZOLAM HCL 2 MG/ML PO SYRP: 8.0000 mg | ORAL_SOLUTION | Freq: Once | ORAL | Status: DC | PRN

## 2020-07-25 MED ORDER — IOHEXOL 350 MG/ML SOLN
100.0000 mL | Freq: Once | INTRAVENOUS | Status: AC | PRN
  Administered 2020-07-25: 100 mL via INTRAVENOUS

## 2020-07-25 MED ORDER — OXYCODONE HCL 5 MG PO TABS: 5.0000 mg | ORAL_TABLET | ORAL | Status: DC | PRN

## 2020-07-25 MED ORDER — SODIUM CHLORIDE 0.9 % IV SOLN: INTRAVENOUS | Status: DC

## 2020-07-25 MED ORDER — SODIUM CHLORIDE 0.9% FLUSH: 3.0000 mL | INTRAVENOUS | Status: DC | PRN

## 2020-07-25 MED ORDER — NITROGLYCERIN 5 MG/ML IV SOLN
INTRAVENOUS | Status: DC | PRN
  Administered 2020-07-25 (×2): 300 ug via INTRAVENOUS

## 2020-07-25 MED ORDER — SODIUM CHLORIDE 0.9% FLUSH
3.0000 mL | Freq: Two times a day (BID) | INTRAVENOUS | Status: DC
  Administered 2020-07-25 – 2020-07-27 (×4): 3 mL via INTRAVENOUS

## 2020-07-25 MED ORDER — DIPHENHYDRAMINE HCL 50 MG/ML IJ SOLN: 50.0000 mg | Freq: Once | INTRAMUSCULAR | Status: DC | PRN

## 2020-07-25 MED ORDER — LABETALOL HCL 5 MG/ML IV SOLN: 10.0000 mg | Freq: Once | INTRAVENOUS | Status: DC

## 2020-07-25 SURGICAL SUPPLY — 22 items
BALLN ULTRVRSE 2X220X150 (BALLOONS) ×2
BALLOON ULTRVRSE 2X220X150 (BALLOONS) ×1 IMPLANT
CANISTER PENUMBRA ENGINE (MISCELLANEOUS) ×2 IMPLANT
CATH ANGIO 5F 100CM .035 PIG (CATHETERS) ×2 IMPLANT
CATH BEACON 5 .035 100 H1 TIP (CATHETERS) ×2 IMPLANT
CATH CXI SUPP ANG 4FR 135 (CATHETERS) ×1 IMPLANT
CATH CXI SUPP ANG 4FR 135CM (CATHETERS) ×2
CATH INDIGO CAT RX KIT (CATHETERS) ×2 IMPLANT
DEVICE SAFEGUARD 24CM (GAUZE/BANDAGES/DRESSINGS) ×2 IMPLANT
DEVICE STARCLOSE SE CLOSURE (Vascular Products) ×2 IMPLANT
GLIDEWIRE ADV .035X260CM (WIRE) ×2 IMPLANT
KIT ENCORE 26 ADVANTAGE (KITS) ×2 IMPLANT
NEEDLE ENTRY 21GA 7CM ECHOTIP (NEEDLE) ×2 IMPLANT
PACK ANGIOGRAPHY (CUSTOM PROCEDURE TRAY) ×2 IMPLANT
SET INTRO CAPELLA COAXIAL (SET/KITS/TRAYS/PACK) ×2 IMPLANT
SHEATH BRITE TIP 5FRX11 (SHEATH) ×2 IMPLANT
SHEATH BRITE TIP 6FRX11 (SHEATH) ×2 IMPLANT
SHEATH GUIDING CAROTID 6FRX90 (SHEATH) ×2 IMPLANT
SYR MEDRAD MARK 7 150ML (SYRINGE) ×2 IMPLANT
TUBING CONTRAST HIGH PRESS 48 (TUBING) ×4 IMPLANT
WIRE GUIDERIGHT .035X150 (WIRE) ×2 IMPLANT
WIRE RUNTHROUGH .014X300CM (WIRE) ×2 IMPLANT

## 2020-07-25 NOTE — ED Provider Notes (Signed)
[facility]    CSN: [id number] Arrival date & time: 07/25/20  0859      History   Chief Complaint Chief Complaint  Patient presents with  . Numbness    Right hand    HPI Jesse Chapman is a 55 y.o. male presenting for sudden onset of right hand and forearm pain as well as paleness of the skin.  This started about 45 minutes prior to arrival to the urgent care.  Patient states that he vomited a couple times this morning and that he had the sudden pain.  The hand and forearm feel cold in comparison to the other hand and forearm.  He also has some numbness to the area of his hand and fingers.  No history of any similar problems.  No recent history of injury or trauma.  Patient denies any history of blood clotting disorders.  He is mostly Spanish-speaking and his daughter is helping to translate.  They admit to some "heart problems," but do not elaborate on what that means.  He does have history of hypertension.  Unsure if he is taking any medication for the hypertension.  Currently he denies any fever but is sweating due to the pain.  Has also felt nauseous.  Denies any chest pain or breathing difficulty.  No neck pain or back pain.  Denies smoking, alcohol or drug use. Is former smoker. No other complaints or concerns.  HPI  No past medical history on file.  There are no problems to display for this patient.   Past Surgical History:  Procedure Laterality Date  . GALLBLADDER SURGERY         Home Medications    Prior to Admission medications   Not on File    Family History Family History  Problem Relation Age of Onset  . Healthy Mother   . Healthy Father     Social History Social History   Tobacco Use  . Smoking status: Former Games developer  . Smokeless tobacco: Never Used  Vaping Use  . Vaping Use: Former  Substance Use Topics  . Alcohol use: Never  . Drug use: Never     Allergies   Patient has no known allergies.   Review of Systems Review  of Systems  Constitutional: Positive for diaphoresis. Negative for fatigue and fever.  Respiratory: Negative for cough and shortness of breath.   Cardiovascular: Negative for chest pain.  Gastrointestinal: Positive for nausea and vomiting.  Musculoskeletal: Positive for arthralgias. Negative for back pain, joint swelling and neck pain.  Skin: Positive for color change and pallor. Negative for rash and wound.  Neurological: Positive for numbness. Negative for dizziness, weakness and headaches.  Hematological: Does not bruise/bleed easily.     Physical Exam Triage Vital Signs ED Triage Vitals  Enc Vitals Group     BP 07/25/20 0916 (!) 176/108     Pulse Rate 07/25/20 0916 81     Resp 07/25/20 0916 (!) 24     Temp 07/25/20 0916 97.6 F (36.4 C)     Temp Source 07/25/20 0916 Oral     SpO2 07/25/20 0916 98 %     Weight --      Height --      Head Circumference --      Peak Flow --      Pain Score 07/25/20 0912 10     Pain Loc --      Pain Edu? --      Excl. in GC? --  No data found.  Updated Vital Signs BP (!) 176/108 (BP Location: Left Arm)   Pulse 81   Temp 97.6 F (36.4 C) (Oral)   Resp (!) 24   SpO2 98%       Physical Exam Vitals and nursing note reviewed.  Constitutional:      General: He is in acute distress.     Appearance: Normal appearance. He is well-developed and well-nourished. He is diaphoretic.     Comments: Patient appears in pain, tearful at times, holding arm/hand.   HENT:     Head: Normocephalic and atraumatic.  Eyes:     General: No scleral icterus.    Conjunctiva/sclera: Conjunctivae normal.  Cardiovascular:     Rate and Rhythm: Normal rate and regular rhythm.     Pulses: Decreased pulses.          Radial pulses are 0 on the right side and 2+ on the left side.     Heart sounds: Normal heart sounds.  Pulmonary:     Effort: Pulmonary effort is normal. No respiratory distress.     Breath sounds: Normal breath sounds. No wheezing, rhonchi or  rales.  Musculoskeletal:        General: No edema.     Right forearm: No swelling or deformity.     Right wrist: No swelling. Normal range of motion.     Right hand: No swelling. Normal range of motion. Decreased sensation of the ulnar distribution, median distribution and radial distribution. Decreased capillary refill.     Cervical back: Normal range of motion and neck supple.     Comments: Pallor of right forearm distal to entire hand, this part of his extremity is cool in comparison to the opposite extremity  Skin:    General: Skin is warm.  Neurological:     General: No focal deficit present.     Mental Status: He is alert. Mental status is at baseline.     Motor: No weakness.     Gait: Gait normal.  Psychiatric:        Attention and Perception: Attention normal.        Mood and Affect: Mood and affect and mood normal.        Speech: Speech normal.        Behavior: Behavior is cooperative.      UC Treatments / Results  Labs (all labs ordered are listed, but only abnormal results are displayed) Labs Reviewed - No data to display  EKG   Radiology No results found.  Procedures Procedures (including critical care time)  Medications Ordered in UC Medications - No data to display  Initial Impression / Assessment and Plan / UC Course  I have reviewed the triage vital signs and the nursing notes.  Pertinent labs & imaging results that were available during my care of the patient were reviewed by me and considered in my medical decision making (see chart for details).   55 y/o male presenting with sudden onset of right arm/hand pain, numbness, paleness over the past 45 min to 1 hr. Progressively worsening. No trauma or hx of coagulopathy.   On exam, he is in distress due to pain. BP elevated at 176/108. Heart RRR and lungs CTAB. Unable to palpate pulses of RUE. Arm is cold and pale and patient in significant pain with diaphoresis. Suspect ischemia to right arm. EMS called  immediately IV started left AC. Patient transported to [facility] in stable condition.  Final Clinical Impressions(s) / UC Diagnoses  Final diagnoses:  Right arm pain  Paresthesias  Pallor of extremity  Essential hypertension     Discharge Instructions     You have been advised to follow up immediately in the emergency department for concerning signs.symptoms. If you declined EMS transport, please have a family member take you directly to the ED at this time. Do not delay. Based on concerns about condition, if you do not follow up in th e ED, you may risk poor outcomes including worsening of condition, delayed treatment and potentially life threatening issues. If you have declined to go to the ED at this time, you should call your PCP immediately to set up a follow up appointment.  Go to ED for red flag symptoms, including; fevers you cannot reduce with Tylenol/Motrin, severe headaches, vision changes, numbness/weakness in part of the body, lethargy, confusion, intractable vomiting, severe dehydration, chest pain, breathing difficulty, severe persistent abdominal or pelvic pain, signs of severe infection (increased redness, swelling of an area), feeling faint or passing out, dizziness, etc. You should especially go to the ED for sudden acute worsening of condition if you do not elect to go at this time.     ED Prescriptions    None     PDMP not reviewed this encounter.   Shirlee Latch, PA-C 07/25/20 1051

## 2020-07-25 NOTE — Consult Note (Addendum)
[facility] Vascular Consult Note  MRN : [id number]  Jesse Chapman is a 55 y.o. (1966-03-05) male who presents with chief complaint of  Chief Complaint  Patient presents with  . Clot in Hand   History of Present Illness:  The patient is a 55 year old male with no significant past medical history who presented to [facility] emergency department with a chief complaint of progressively worsening numbness and discoloration to the right hand.  This started about 45 minutes prior to arrival to the urgent care. Patient states that he vomited a couple times this morning and that he had the sudden pain.  The hand and forearm feel cold in comparison to the other hand and forearm.  He also has some numbness to the area of his hand and fingers.  No history of any similar problems.  No recent history of injury or trauma.  Patient denies any history of blood clotting disorders.  He is mostly Spanish-speaking and his daughter is helping to translate.  They admit to some "heart problems," but do not elaborate on what that means.  He does have history of hypertension.  Unsure if he is taking any medication for the hypertension.  Currently he denies any fever but is sweating due to the pain.  Has also felt nauseous. Denies any chest pain or breathing difficulty.  No neck pain or back pain.  Denies smoking, alcohol or drug use. Is former smoker. No other complaints or concerns.   CTA (07/26/19): Findings consistent with acute arterial occlusion of the distal brachial artery at the elbow, suspect thrombo embolus.  Vascular surgery was consulted by Dr. Roxan Hockeyobinson in the setting of right upper extremity ischemia and the possibility of endovascular intervention.  Current Facility-Administered Medications  Medication Dose Route Frequency Provider Last Rate Last Admin  . 0.9 %  sodium chloride infusion   Intravenous Once Willy Eddyobinson, Patrick, MD      . heparin  ADULT infusion 100 units/mL (25000 units/23050mL)  1,400 Units/hr Intravenous Continuous Derrek GuHicks, Morgan L, RPH      . heparin bolus via infusion 5,500 Units  5,500 Units Intravenous Once Derrek GuHicks, Morgan L, RPH      . HYDROmorphone (DILAUDID) injection 0.5 mg  0.5 mg Intravenous Q2H PRN Willy Eddyobinson, Patrick, MD   0.5 mg at 07/25/20 1018  . lactated ringers bolus 1,000 mL  1,000 mL Intravenous Once Willy Eddyobinson, Patrick, MD       No current outpatient medications on file.   History reviewed. No pertinent past medical history.  Past Surgical History:  Procedure Laterality Date  . GALLBLADDER SURGERY     Social History Social History   Tobacco Use  . Smoking status: Former Games developermoker  . Smokeless tobacco: Never Used  Vaping Use  . Vaping Use: Former  Substance Use Topics  . Alcohol use: Never  . Drug use: Never   Family History Family History  Problem Relation Age of Onset  . Healthy Mother   . Healthy Father   Denies family history of peripheral artery disease, venous disease or bleeding/clotting disorders.  No Known Allergies  REVIEW OF SYSTEMS (Negative unless checked)  Constitutional: [] Weight loss  [] Fever  [] Chills Cardiac: [] Chest pain   [] Chest pressure   [] Palpitations   [] Shortness of breath when laying flat   [] Shortness of breath at rest   [] Shortness of breath with exertion. Vascular:  [] Pain in legs with walking   [] Pain in legs at rest   [] Pain in  legs when laying flat   [] Claudication   [] Pain in feet when walking  [] Pain in feet at rest  [] Pain in feet when laying flat   [] History of DVT   [] Phlebitis   [] Swelling in legs   [] Varicose veins   [] Non-healing ulcers Pulmonary:   [] Uses home oxygen   [] Productive cough   [] Hemoptysis   [] Wheeze  [] COPD   [] Asthma Neurologic:  [] Dizziness  [] Blackouts   [] Seizures   [] History of stroke   [] History of TIA  [] Aphasia   [] Temporary blindness   [] Dysphagia   [] Weakness or numbness in arms   [] Weakness or numbness in legs Musculoskeletal:   [] Arthritis   [] Joint swelling   [] Joint pain   [] Low back pain Hematologic:  [] Easy bruising  [] Easy bleeding   [] Hypercoagulable state   [] Anemic  [] Hepatitis Gastrointestinal:  [] Blood in stool   [] Vomiting blood  [] Gastroesophageal reflux/heartburn   [] Difficulty swallowing. Genitourinary:  [] Chronic kidney disease   [] Difficult urination  [] Frequent urination  [] Burning with urination   [] Blood in urine Skin:  [] Rashes   [] Ulcers   [] Wounds Psychological:  [] History of anxiety   []  History of major depression.  Positive for right upper extremity numbness, pain and pallor.  Physical Examination  Vitals:   07/25/20 1006 07/25/20 1205  BP: (!) 177/86   Pulse: 82   Resp: 18   Temp: 97.6 F (36.4 C)   TempSrc: Oral   SpO2: 98%   Weight:  98 kg  Height: 5\' 4"  (1.626 m)    Body mass index is 37.09 kg/m. Gen:  WD/WN, NAD Head: [place]/AT, No temporalis wasting. Prominent temp pulse not noted. Ear/Nose/Throat: Hearing grossly intact, nares w/o erythema or drainage, oropharynx w/o Erythema/Exudate Eyes: Sclera non-icteric, conjunctiva clear Neck: Trachea midline.  No JVD.  Pulmonary:  Good air movement, respirations not labored, equal bilaterally.  Cardiac: RRR, normal S1, S2. Vascular:  Vessel Right Left  Radial Non-Palpable Palpable  Ulnar Non-Palpable Palpable  Brachial Non-Palpable Palpable                           Right upper extremity: Extremity is soft.  Extremity is warm however transitions to cooler at the elbow.  Motor/sensory is intact however there is a delayed capillary refill and unable to palpate radial or ulnar pulses.  Gastrointestinal: soft, non-tender/non-distended. No guarding/reflex.  Musculoskeletal: M/S 5/5 throughout.  Extremities without ischemic changes.  No deformity or atrophy. No edema. Neurologic: Sensation grossly intact in extremities.  Symmetrical.  Speech is fluent. Motor exam as listed above. Psychiatric: Judgment intact, Mood & affect  appropriate for pt's clinical situation. Dermatologic: No rashes or ulcers noted.  No cellulitis or open wounds. Lymph : No Cervical, Axillary, or Inguinal lymphadenopathy.  CBC Lab Results  Component Value Date   WBC 18.1 (H) 07/25/2020   HGB 15.7 07/25/2020   HCT 44.5 07/25/2020   MCV 85.7 07/25/2020   PLT 363 07/25/2020   BMET    Component Value Date/Time   NA 139 07/25/2020 1021   K 4.0 07/25/2020 1021   CL 101 07/25/2020 1021   CO2 24 07/25/2020 1021   GLUCOSE 162 (H) 07/25/2020 1021   BUN 23 (H) 07/25/2020 1021   CREATININE 1.07 07/25/2020 1021   CALCIUM 9.4 07/25/2020 1021   GFRNONAA >60 07/25/2020 1021   GFRAA >60 08/31/2019 1840   Estimated Creatinine Clearance: 85.3 mL/min (by C-G formula based on SCr of 1.07 mg/dL).  COAG No results  found for: INR, PROTIME  Radiology CT ANGIO UP EXTREM RIGHT W &/OR WO CONTRAST  Result Date: 07/25/2020 CLINICAL DATA:  Concern for right upper extremity acute ischemia, diminished radial pulse EXAM: CT ANGIOGRAPHY UPPER RIGHT EXTREMITY TECHNIQUE: Multidetector CT imaging performed of the right upper extremity as a CTA exam. Multiplanar reconstruction images and MIPS were obtained to evaluate the vascular anatomy. CONTRAST:  OMNIPAQUE IOHEXOL 350 MG/ML SOLN COMPARISON:  07/25/2020 FINDINGS: Vascular: Visualized right subclavian and axillary arteries are patent. In the upper arm the brachial artery is patent. No proximal dissection. At the elbow, there is abrupt truncation of the brachial artery at or very close to the bifurcation of the radial and ulnar arteries compatible with acute arterial occlusion, appearance suggest thromboembolism. Very minimal reconstitution of the forearm and wrist arterial system to confirm distal vasculature patency. Nonvascular: No focal soft tissue abnormality or swelling of the upper extremity. No acute osseous finding. Review of the MIP images confirms the above findings. IMPRESSION: Findings consistent  with acute arterial occlusion of the distal brachial artery at the elbow, suspect thrombo embolus. These results were called by telephone at the time of interpretation on 07/25/2020 at 11:49 am to provider Willy Eddy , who verbally acknowledged these results. Electronically Signed   By: Judie Petit.  Shick M.D.   On: 07/25/2020 11:50   CT Angio Chest/Abd/Pel for Dissection W and/or Wo Contrast  Result Date: 07/25/2020 CLINICAL DATA:  Numbness in right hand.  Vomiting and sweating. EXAM: CT ANGIOGRAPHY CHEST, ABDOMEN AND PELVIS TECHNIQUE: Non-contrast CT of the chest was initially obtained. Multidetector CT imaging through the chest, abdomen and pelvis was performed using the standard protocol during bolus administration of intravenous contrast. Multiplanar reconstructed images and MIPs were obtained and reviewed to evaluate the vascular anatomy. CONTRAST:  OMNIPAQUE IOHEXOL 350 MG/ML SOLN COMPARISON:  None. FINDINGS: CTA CHEST FINDINGS Cardiovascular: Heart size appears normal. No pericardial effusion. No signs of thoracic aortic dissection. Aortic atherosclerosis. Several irregular noncalcified atherosclerotic plaques are identified arising off the wall of the transverse aortic arch, image 26/4 and image 27/4. Mediastinum/Nodes: No enlarged mediastinal, hilar, or axillary lymph nodes. Thyroid gland, trachea, and esophagus demonstrate no significant findings. Lungs/Pleura: No pleural effusion. No airspace consolidation. Mild ground-glass attenuation is identified in both lower lobes and right middle lobe. Musculoskeletal: No chest wall abnormality. No acute or significant osseous findings. Review of the MIP images confirms the above findings. CTA ABDOMEN AND PELVIS FINDINGS VASCULAR Aorta: Normal caliber aorta without aneurysm, dissection, vasculitis or significant stenosis. Celiac: Mild stenosis at the origin of the left gastric artery which arises directly off the aorta (proximal to the celiac artery). There  is also mild stenosis of the origin of the celiac artery. SMA: Patent without evidence of aneurysm, dissection, vasculitis or significant stenosis. Renals: Both renal arteries are patent without evidence of aneurysm, dissection, vasculitis, fibromuscular dysplasia or significant stenosis. IMA: Patent without evidence of aneurysm, dissection, vasculitis or significant stenosis. Inflow: Patent without evidence of aneurysm, dissection, vasculitis or significant stenosis. Veins: No obvious venous abnormality within the limitations of this arterial phase study. Review of the MIP images confirms the above findings. NON-VASCULAR Hepatobiliary: No focal liver abnormality is seen. Status post cholecystectomy. No biliary dilatation. Pancreas: Unremarkable. No pancreatic ductal dilatation or surrounding inflammatory changes. Spleen: Normal in size without focal abnormality. Adrenals/Urinary Tract: Normal appearance of the adrenal glands. Wedge-shaped area of decreased enhancement is identified within the lateral cortex of the right mid kidney, image 94/4. Left kidney is unremarkable.  No hydronephrosis identified bilaterally. Urinary bladder is normal. Stomach/Bowel: Small hiatal hernia. The stomach appears distended. The small bowel loops are abnormally dilated with multiple air-fluid levels. These measure up to 3.1 cm. There is a transition point to decreased caliber terminal ileum within the lower abdomen, image 153/4. No signs of pneumatosis, bowel perforation or abscess. Scattered colonic diverticula noted. Lymphatic: No abdominopelvic adenopathy. Reproductive: Prostate is unremarkable. Other: No free fluid or fluid collections. Musculoskeletal: No acute or significant osseous findings. Review of the MIP images confirms the above findings. IMPRESSION: 1. No evidence for aortic dissection. There is aortic atherosclerosis with several small irregular noncalcified atherosclerotic plaques arising off the wall of the transverse  aortic arch. 2. Examination is positive for small bowel obstruction. Transition point to decreased caliber terminal ileum within the lower abdomen. Long segment of terminal ileum stricture may reflect sequelae of inflammatory/infectious enteritis versus ischemic stricture. The SMA and its branches however appears patent. No signs of pneumatosis or portal venous gas. 3. Wedge-shaped area of decreased enhancement within the lateral cortex of the right mid kidney concerning for infarct versus focal pyelonephritis. Aortic Atherosclerosis (ICD10-I70.0). Critical Value/emergent results were called by telephone at the time of interpretation on 07/25/2020 at 11:21 am to provider Willy Eddy , who verbally acknowledged these results. Electronically Signed   By: Signa Kell M.D.   On: 07/25/2020 11:21   Assessment/Plan The patient is a 55 year old male with no significant past medical history who presented to the [facility] emergency department with a chief complaint of progressively worsening numbness and discoloration to the right hand.  1.  Right Upper Extremity Ischemia: Patient presents with ischemia to the right upper extremity.  On physical exam, the extremity is cooler at the elbow distally.  Hard to palpate radial and ulnar pulses.  Motor/sensory is intact however the situation is emergent.  In an attempt to reach the vascular lysed the right upper extremity in the setting of ischemia recommend undergoing a right upper extremity angiogram with possible intervention and attempt to assess the patient's anatomy, extent of thrombus and restore arterial patency.  Procedure, risks and benefits were explained to the patient.  All questions were answered.  The patient wished to proceed.  Unsure of cause at this time.  Patient is not in A. fib nor has any known history of atherosclerotic disease to the extremity.  Angiogram would certainly assess the patient's anatomy and rule out any  structural issues.  We will order a urine tox screen as sometimes cocaine use can contribute arterial dissection / spasm which can lead to ischemia.  2. Need for anticoagulation: Heparin has been started. Patient understands that he most likely be on some type of blood thinner status post completion of the right upper extremity angiogram.  3. SBO: NG tube placed for decompression.  General surgery consulted  Seen and examined with Dr. Romie Jumper, PA-C  07/25/2020 12:40 PM  This note was created with Dragon medical transcription system.  Any error is purely unintentional

## 2020-07-25 NOTE — Consult Note (Signed)
SURGICAL CONSULTATION NOTE   HISTORY OF PRESENT ILLNESS (HPI):  54 y.o. male presented to [facility] ED for evaluation of abdominal pain and right upper extremity pain. Patient reports yesterday night he had an acute episode of abdominal pain, nausea and vomiting.  He was able to sleep but at 5 AM he started having pain on his right upper extremity.  He decided to come to the emergency room.  Abdominal pain is generalized.  There is no pain radiation.  There was no alleviating or aggravating factors.  At the ED he was found with elevated white blood cell of 18,000.  Of note 6-month ago his white blood cells were 14,000.  CT arteriogram scan of the abdomen and pelvis showed small bowel dilation consistent with small bowel obstruction with transition point in the terminal ileum.  There is a stricture of the terminal ileum.  There is no free air or free fluid.  I personally evaluated the images.  Surgery is consulted by Dr. Roxan Hockey in this context for evaluation and management of small bowel obstruction.  PAST MEDICAL HISTORY (PMH):  History reviewed. No pertinent past medical history.   PAST SURGICAL HISTORY (PSH):  Past Surgical History:  Procedure Laterality Date  . GALLBLADDER SURGERY       MEDICATIONS:  Prior to Admission medications   Not on File     ALLERGIES:  No Known Allergies   SOCIAL HISTORY:  Social History   Socioeconomic History  . Marital status: Single    Spouse name: Not on file  . Number of children: Not on file  . Years of education: Not on file  . Highest education level: Not on file  Occupational History  . Not on file  Tobacco Use  . Smoking status: Former Games developer  . Smokeless tobacco: Never Used  Vaping Use  . Vaping Use: Former  Substance and Sexual Activity  . Alcohol use: Never  . Drug use: Never  . Sexual activity: Not on file  Other Topics Concern  . Not on file  Social History Narrative  . Not on file   Social Determinants of Health    Financial Resource Strain: Not on file  Food Insecurity: Not on file  Transportation Needs: Not on file  Physical Activity: Not on file  Stress: Not on file  Social Connections: Not on file  Intimate Partner Violence: Not on file      FAMILY HISTORY:  Family History  Problem Relation Age of Onset  . Healthy Mother   . Healthy Father      REVIEW OF SYSTEMS:  Constitutional: denies weight loss, fever, chills, or sweats  Eyes: denies any other vision changes, history of eye injury  ENT: denies sore throat, hearing problems  Respiratory: denies shortness of breath, wheezing  Cardiovascular: denies chest pain, palpitations  Gastrointestinal: Positive abdominal pain, nausea and vomiting Genitourinary: denies burning with urination or urinary frequency Musculoskeletal: denies any other joint pains or cramps  Skin: denies any other rashes or skin discolorations  Neurological: denies any other headache, dizziness, positive for weakness of the right upper extremity Psychiatric: denies any other depression, anxiety   All other review of systems were negative   VITAL SIGNS:  Temp:  [97.6 F (36.4 C)] 97.6 F (36.4 C) (01/18 1359) Pulse Rate:  [81-100] 100 (01/18 1359) Resp:  [18-24] 23 (01/18 1359) BP: (176-194)/(86-108) 194/101 (01/18 1359) SpO2:  [94 %-98 %] 95 % (01/18 1512) Weight:  [98 kg] 98 kg (01/18 1205)  Height: 5\' 4"  (162.6 cm) Weight: 98 kg BMI (Calculated): 37.07   INTAKE/OUTPUT:  This shift: Total I/O In: -  Out: 250 [Other:250]  Last 2 shifts: @IOLAST2SHIFTS @   PHYSICAL EXAM:  Constitutional:  -- Normal body habitus  -- Awake, alert, and oriented x3  Eyes:  -- Pupils equally round and reactive to light  -- No scleral icterus  Ear, nose, and throat:  -- No jugular venous distension  Pulmonary:  -- No crackles  -- Equal breath sounds bilaterally -- Breathing non-labored at rest Cardiovascular:  -- S1, S2 present  -- No pericardial  rubs Gastrointestinal:  -- Abdomen soft, mild tender, distended, no guarding or rebound tenderness -- No abdominal masses appreciated, pulsatile or otherwise  Musculoskeletal and Integumentary:  -- Wounds: None appreciated -- Extremities: B/L UE and LE FROM, hands and feet warm, no edema  Neurologic:  -- Motor function: intact and symmetric -- Sensation: intact and symmetric   Labs:  CBC Latest Ref Rng & Units 07/25/2020 08/31/2019  WBC 4.0 - 10.5 K/uL 18.1(H) 14.3(H)  Hemoglobin 13.0 - 17.0 g/dL 07/27/2020 09/02/2019  Hematocrit 20.9 - 52.0 % 44.5 39.8  Platelets 150 - 400 K/uL 363 395   CMP Latest Ref Rng & Units 07/25/2020 08/31/2019  Glucose 70 - 99 mg/dL 07/27/2020) 09/02/2019)  BUN 6 - 20 mg/dL 836(O) 18  Creatinine 294(T - 1.24 mg/dL 65(Y 6.50  Sodium 3.54 - 145 mmol/L 139 137  Potassium 3.5 - 5.1 mmol/L 4.0 3.9  Chloride 98 - 111 mmol/L 101 102  CO2 22 - 32 mmol/L 24 29  Calcium 8.9 - 10.3 mg/dL 9.4 9.1  Total Protein 6.5 - 8.1 g/dL 7.7 -  Total Bilirubin 0.3 - 1.2 mg/dL 1.0 -  Alkaline Phos 38 - 126 U/L 58 -  AST 15 - 41 U/L 29 -  ALT 0 - 44 U/L 41 -    Imaging studies:  EXAM: CT ANGIOGRAPHY CHEST, ABDOMEN AND PELVIS  TECHNIQUE: Non-contrast CT of the chest was initially obtained.  Multidetector CT imaging through the chest, abdomen and pelvis was performed using the standard protocol during bolus administration of intravenous contrast. Multiplanar reconstructed images and MIPs were obtained and reviewed to evaluate the vascular anatomy.  CONTRAST:  6.56 OMNIPAQUE IOHEXOL 350 MG/ML SOLN  COMPARISON:  None.  FINDINGS: CTA CHEST FINDINGS  Cardiovascular: Heart size appears normal. No pericardial effusion. No signs of thoracic aortic dissection. Aortic atherosclerosis. Several irregular noncalcified atherosclerotic plaques are identified arising off the wall of the transverse aortic arch, image 26/4 and image 27/4.  Mediastinum/Nodes: No enlarged mediastinal, hilar, or  axillary lymph nodes. Thyroid gland, trachea, and esophagus demonstrate no significant findings.  Lungs/Pleura: No pleural effusion. No airspace consolidation. Mild ground-glass attenuation is identified in both lower lobes and right middle lobe.  Musculoskeletal: No chest wall abnormality. No acute or significant osseous findings.  Review of the MIP images confirms the above findings.  CTA ABDOMEN AND PELVIS FINDINGS  VASCULAR  Aorta: Normal caliber aorta without aneurysm, dissection, vasculitis or significant stenosis.  Celiac: Mild stenosis at the origin of the left gastric artery which arises directly off the aorta (proximal to the celiac artery). There is also mild stenosis of the origin of the celiac artery.  SMA: Patent without evidence of aneurysm, dissection, vasculitis or significant stenosis.  Renals: Both renal arteries are patent without evidence of aneurysm, dissection, vasculitis, fibromuscular dysplasia or significant stenosis.  IMA: Patent without evidence of aneurysm, dissection, vasculitis or significant stenosis.  Inflow: Patent  without evidence of aneurysm, dissection, vasculitis or significant stenosis.  Veins: No obvious venous abnormality within the limitations of this arterial phase study.  Review of the MIP images confirms the above findings.  NON-VASCULAR  Hepatobiliary: No focal liver abnormality is seen. Status post cholecystectomy. No biliary dilatation.  Pancreas: Unremarkable. No pancreatic ductal dilatation or surrounding inflammatory changes.  Spleen: Normal in size without focal abnormality.  Adrenals/Urinary Tract: Normal appearance of the adrenal glands. Wedge-shaped area of decreased enhancement is identified within the lateral cortex of the right mid kidney, image 94/4. Left kidney is unremarkable. No hydronephrosis identified bilaterally. Urinary bladder is normal.  Stomach/Bowel: Small hiatal hernia.  The stomach appears distended. The small bowel loops are abnormally dilated with multiple air-fluid levels. These measure up to 3.1 cm. There is a transition point to decreased caliber terminal ileum within the lower abdomen, image 153/4. No signs of pneumatosis, bowel perforation or abscess. Scattered colonic diverticula noted.  Lymphatic: No abdominopelvic adenopathy.  Reproductive: Prostate is unremarkable.  Other: No free fluid or fluid collections.  Musculoskeletal: No acute or significant osseous findings.  Review of the MIP images confirms the above findings.  IMPRESSION: 1. No evidence for aortic dissection. There is aortic atherosclerosis with several small irregular noncalcified atherosclerotic plaques arising off the wall of the transverse aortic arch. 2. Examination is positive for small bowel obstruction. Transition point to decreased caliber terminal ileum within the lower abdomen. Long segment of terminal ileum stricture may reflect sequelae of inflammatory/infectious enteritis versus ischemic stricture. The SMA and its branches however appears patent. No signs of pneumatosis or portal venous gas. 3. Wedge-shaped area of decreased enhancement within the lateral cortex of the right mid kidney concerning for infarct versus focal pyelonephritis.  Aortic Atherosclerosis (ICD10-I70.0).  Critical Value/emergent results were called by telephone at the time of interpretation on 07/25/2020 at 11:21 am to provider Willy Eddy , who verbally acknowledged these results.   Electronically Signed   By: Signa Kell M.D.   On: 07/25/2020 11:21   Assessment/Plan:  55 y.o. male with small bowel obstruction of unknown etiology, complicated by pertinent comorbidities including multiple acute limb ischemia of the right upper extremity.  Patient presented with multiple complaints including abdominal pain and right upper extremity weakness and pain.  Currently  the images are consistent with acute occlusion of the right upper extremity.  CTA of the abdomen and pelvis shows patency of the intra-abdominal arteries.  There is a concern of stricture of the ileum that it could be from inflammation versus ischemia.  Patient is currently receiving treatment for his upper extremity acute limb ischemia.  Patient will be on heparin.  Abdominal pain has improved since yesterday.  I will follow patient closely and I will have a low threshold to take him to the operating room for at least a diagnostic laparoscopy if pain does not improve or if the labs deteriorates.  If physical exam and labs shows improvement I might consider to continue observation.  I will follow closely.   Gae Gallop, MD

## 2020-07-25 NOTE — ED Notes (Signed)
Patient is being discharged from the Urgent Care and sent to the Emergency Department via EMS . Per Dr. Adriana Simas and Athena Masse, PA, patient is in need of higher level of care due to needing higher level of care. Patient is aware and verbalizes understanding of plan of care.  Vitals:   07/25/20 0916  BP: (!) 176/108  Pulse: 81  Resp: (!) 24  Temp: 97.6 F (36.4 C)  SpO2: 98%

## 2020-07-25 NOTE — ED Notes (Signed)
Pt with two episodes of emesis this AM, reports abd pain with tenderness. Denies CP/difficulties breathing

## 2020-07-25 NOTE — OR Nursing (Signed)
Dr schnier visualized the NG in stomach under flouro in lab. 400 ml bile suctioned low suction.

## 2020-07-25 NOTE — H&P (Signed)
History and Physical   Jesse Chapman GNF:[id number] DOB: 25-Mar-1966 DOA: 07/25/2020  PCP: Patient, No Pcp Per  Patient coming from: home  I have personally briefly reviewed patient's old medical records in [facility] EMR.  Chief Concern: abdominal pain, nausea, vomiting, right upper extremity pain, numbness  HPI was obtained via [place] interpreter and daughter at bedside and patient. Patient speaks a little [place].  HPI: Jesse Chapman is a 55 y.o. male with medical history significant for no previous medical diagnosis and poor outpatient follow-up, and currently not taking any prescribed medications presented to the emergency department for chief concerns of right arm pain, numbness, and hand discoloration that started approximately 5 AM on 07/25/2020.  He states the arm pain is throbbing pain.  He endorsed that the pain is persistent, 10 out of 10, and associated with skin discoloration.  He states this is never happened before.  He endorsed associated abdominal pain started at 3 and 5 AM this morning, while he was sleeping.  Bilateral lower abdominal pain. He endorses associated nausea and vomiting.  He reports the vomitus is food and bile.  He endorses taking 400 mg ibuprofen this AM prior to presenting to the ED and it did not improve his symptioms.  He denies fever.  He denies diarrhea. He denies burning or pain with urination. He endorses a little weakness.  He states he last ate last night, 07/24/2020.  He has not been able to tolerate p.o. intake due to nausea and vomiting.  He reports not passing of gas since last night.  He reports that his last bowel movement was evening of 07/24/2020.  He reports the stool was a small amount and normal in color.  He denies black or red stools.  He reports that he has never had a colonoscopy.  He states that his stools have gradually decreased in caliber and now has becoming very thin.  He denies weight changes.  He denies family  history and personal history of cancer.  Social history: works in Production designer, theatre/television/film, he formerly smoked tobacco, quit 2 years. He endorses infequent etoh consumption and quit 2 years.   Surgical history: gallbladder removal, about 7 years ago.  Vaccinations: he is vaccinated for covid with two doses.   ROS: Constitutional: no weight change, no fever ENT/Mouth: no sore throat, no rhinorrhea Eyes: no eye pain, no vision changes Cardiovascular: no chest pain, no dyspnea,  no edema, no palpitations Respiratory: no cough, no sputum, no wheezing Gastrointestinal: + nausea, + vomiting, no diarrhea, no constipation Genitourinary: no urinary incontinence, no dysuria, no hematuria Musculoskeletal: no arthralgias, no myalgias Skin: no skin lesions, no pruritus, Neuro: + weakness, no loss of consciousness, no syncope Psych: no anxiety, no depression, + decrease appetite Heme/Lymph: no bruising, no bleeding  ED Course: Discussed with ED provider, patient requiring hospitalization due to small bowel obstruction and right upper quadrant arterial occlusion.  ED provider discussed with general surgery, Dr. Maia Plan and vascular surgeon Dr. Gilda Crease. Patient will be taken to the OR by Dr. Gilda Crease first.   Heparin gtt was started.   Assessment/Plan  Principal Problem:   Arterial occlusion Active Problems:   SBO (small bowel obstruction) (HCC)   Obesity (BMI 30-39.9)   Arterial occlusion-Heparin GTT started - Pharmacy consulted for heparin - Vascular surgeon, Dr. Gilda Crease has been consulted and will take patient to the OR - Patient remains n.p.o.  Acute abdomen, secondary to SBO with transition point at the terminal ileum-NG tube order in  place for decompression - Patient has never had a colonoscopy and he endorses pencil thin stool - N.p.o. - Dr. Maia Planintron has been consulted  Pain control with Dilaudid and fentanyl prn  Patient meets SIRs criteria, however I do not suspect this is sepsis etiology  nor from infectious etiology - I suspect he has elevated respiration rate are due to increased pain and leukocytosis due to reactive from either arterial occlusion, sbo, from possible carcinoma, colon source, as possible underlying etiology  Right mid kidney with wedge-shaped area of decreased enhancement- CT reads concerning for infarct versus focal pyelonephritis  As needed medications: Acetaminophen, ondansetron, fentanyl, Dilaudid Chart reviewed.   DVT prophylaxis: Heparin GTT Code Status: Full code Diet: N.p.o. Family Communication: Updated daughter at bedside Disposition Plan: Pending clinical course Consults called: General surgery and vascular surgery Admission status: Inpatient with telemetry  History reviewed. No pertinent past medical history.  Past Surgical History:  Procedure Laterality Date  . GALLBLADDER SURGERY     Social History:  reports that he has quit smoking. He has never used smokeless tobacco. He reports that he does not drink alcohol and does not use drugs.  No Known Allergies Family History  Problem Relation Age of Onset  . Healthy Mother   . Healthy Father    Family history: Family history reviewed and not pertinent  Prior to Admission medications   Not on File   Physical Exam: Vitals:   07/25/20 1006 07/25/20 1205  BP: (!) 177/86   Pulse: 82   Resp: 18   Temp: 97.6 F (36.4 C)   TempSrc: Oral   SpO2: 98%   Weight:  98 kg  Height: 5\' 4"  (1.626 m)    Constitutional: appears age-appropriate, NAD, calm, comfortable Eyes: PERRL, lids and conjunctivae normal ENMT: Mucous membranes are moist. Posterior pharynx clear of any exudate or lesions. Age-appropriate dentition. Hearing appropriate Neck: normal, supple, no masses, no thyromegaly Respiratory: clear to auscultation bilaterally, no wheezing, no crackles. Normal respiratory effort. No accessory muscle use.  Cardiovascular: Regular rate and rhythm, no murmurs / rubs / gallops. No extremity  edema. 2+ pedal pulses. No carotid bruits.  Abdomen: Obese abdomen, diffuse tenderness, no masses palpated, no hepatosplenomegaly.  Musculoskeletal: no clubbing / cyanosis. No joint deformity upper and lower extremities. Good ROM, no contractures, no atrophy. Normal muscle tone.  Skin: no rashes, lesions, ulcers. No induration. Tattoos of the right upper extremity appears old and negative for visual evidence of active infection Neurologic: Sensation intact. Strength 5/5 in all 4.  Psychiatric: Normal judgment and insight. Alert and oriented x 3. Normal mood.   EKG: independently reviewed, showing sinus rhythm with rate of 72, QTc 433  Imaging on Admission: I personally reviewed and I agree with radiologist reading as below.  CT ANGIO UP EXTREM RIGHT W &/OR WO CONTRAST  Result Date: 07/25/2020 CLINICAL DATA:  Concern for right upper extremity acute ischemia, diminished radial pulse EXAM: CT ANGIOGRAPHY UPPER RIGHT EXTREMITY TECHNIQUE: Multidetector CT imaging performed of the right upper extremity as a CTA exam. Multiplanar reconstruction images and MIPS were obtained to evaluate the vascular anatomy. CONTRAST:  100mL OMNIPAQUE IOHEXOL 350 MG/ML SOLN COMPARISON:  07/25/2020 FINDINGS: Vascular: Visualized right subclavian and axillary arteries are patent. In the upper arm the brachial artery is patent. No proximal dissection. At the elbow, there is abrupt truncation of the brachial artery at or very close to the bifurcation of the radial and ulnar arteries compatible with acute arterial occlusion, appearance suggest thromboembolism. Very minimal  reconstitution of the forearm and wrist arterial system to confirm distal vasculature patency. Nonvascular: No focal soft tissue abnormality or swelling of the upper extremity. No acute osseous finding. Review of the MIP images confirms the above findings. IMPRESSION: Findings consistent with acute arterial occlusion of the distal brachial artery at the elbow,  suspect thrombo embolus. These results were called by telephone at the time of interpretation on 07/25/2020 at 11:49 am to provider Willy Eddy , who verbally acknowledged these results. Electronically Signed   By: Judie Petit.  Shick M.D.   On: 07/25/2020 11:50   CT Angio Chest/Abd/Pel for Dissection W and/or Wo Contrast  Result Date: 07/25/2020 CLINICAL DATA:  Numbness in right hand.  Vomiting and sweating. EXAM: CT ANGIOGRAPHY CHEST, ABDOMEN AND PELVIS TECHNIQUE: Non-contrast CT of the chest was initially obtained. Multidetector CT imaging through the chest, abdomen and pelvis was performed using the standard protocol during bolus administration of intravenous contrast. Multiplanar reconstructed images and MIPs were obtained and reviewed to evaluate the vascular anatomy. CONTRAST:  OMNIPAQUE IOHEXOL 350 MG/ML SOLN COMPARISON:  None. FINDINGS: CTA CHEST FINDINGS Cardiovascular: Heart size appears normal. No pericardial effusion. No signs of thoracic aortic dissection. Aortic atherosclerosis. Several irregular noncalcified atherosclerotic plaques are identified arising off the wall of the transverse aortic arch, image 26/4 and image 27/4. Mediastinum/Nodes: No enlarged mediastinal, hilar, or axillary lymph nodes. Thyroid gland, trachea, and esophagus demonstrate no significant findings. Lungs/Pleura: No pleural effusion. No airspace consolidation. Mild ground-glass attenuation is identified in both lower lobes and right middle lobe. Musculoskeletal: No chest wall abnormality. No acute or significant osseous findings. Review of the MIP images confirms the above findings. CTA ABDOMEN AND PELVIS FINDINGS VASCULAR Aorta: Normal caliber aorta without aneurysm, dissection, vasculitis or significant stenosis. Celiac: Mild stenosis at the origin of the left gastric artery which arises directly off the aorta (proximal to the celiac artery). There is also mild stenosis of the origin of the celiac artery. SMA: Patent  without evidence of aneurysm, dissection, vasculitis or significant stenosis. Renals: Both renal arteries are patent without evidence of aneurysm, dissection, vasculitis, fibromuscular dysplasia or significant stenosis. IMA: Patent without evidence of aneurysm, dissection, vasculitis or significant stenosis. Inflow: Patent without evidence of aneurysm, dissection, vasculitis or significant stenosis. Veins: No obvious venous abnormality within the limitations of this arterial phase study. Review of the MIP images confirms the above findings. NON-VASCULAR Hepatobiliary: No focal liver abnormality is seen. Status post cholecystectomy. No biliary dilatation. Pancreas: Unremarkable. No pancreatic ductal dilatation or surrounding inflammatory changes. Spleen: Normal in size without focal abnormality. Adrenals/Urinary Tract: Normal appearance of the adrenal glands. Wedge-shaped area of decreased enhancement is identified within the lateral cortex of the right mid kidney, image 94/4. Left kidney is unremarkable. No hydronephrosis identified bilaterally. Urinary bladder is normal. Stomach/Bowel: Small hiatal hernia. The stomach appears distended. The small bowel loops are abnormally dilated with multiple air-fluid levels. These measure up to 3.1 cm. There is a transition point to decreased caliber terminal ileum within the lower abdomen, image 153/4. No signs of pneumatosis, bowel perforation or abscess. Scattered colonic diverticula noted. Lymphatic: No abdominopelvic adenopathy. Reproductive: Prostate is unremarkable. Other: No free fluid or fluid collections. Musculoskeletal: No acute or significant osseous findings. Review of the MIP images confirms the above findings. IMPRESSION: 1. No evidence for aortic dissection. There is aortic atherosclerosis with several small irregular noncalcified atherosclerotic plaques arising off the wall of the transverse aortic arch. 2. Examination is positive for small bowel obstruction.  Transition point to  decreased caliber terminal ileum within the lower abdomen. Long segment of terminal ileum stricture may reflect sequelae of inflammatory/infectious enteritis versus ischemic stricture. The SMA and its branches however appears patent. No signs of pneumatosis or portal venous gas. 3. Wedge-shaped area of decreased enhancement within the lateral cortex of the right mid kidney concerning for infarct versus focal pyelonephritis. Aortic Atherosclerosis (ICD10-I70.0). Critical Value/emergent results were called by telephone at the time of interpretation on 07/25/2020 at 11:21 am to provider Willy Eddy , who verbally acknowledged these results. Electronically Signed   By: Signa Kell M.D.   On: 07/25/2020 11:21   Labs on Admission: I have personally reviewed following labs  CBC: Recent Labs  Lab 07/25/20 1021  WBC 18.1*  NEUTROABS 16.4*  HGB 15.7  HCT 44.5  MCV 85.7  PLT 363   Basic Metabolic Panel: Recent Labs  Lab 07/25/20 1021  NA 139  K 4.0  CL 101  CO2 24  GLUCOSE 162*  BUN 23*  CREATININE 1.07  CALCIUM 9.4   GFR: Estimated Creatinine Clearance: 85.3 mL/min (by C-G formula based on SCr of 1.07 mg/dL). Liver Function Tests: Recent Labs  Lab 07/25/20 1021  AST 29  ALT 41  ALKPHOS 58  BILITOT 1.0  PROT 7.7  ALBUMIN 4.5   Mitesh Rosendahl N Ramiah Helfrich D.O. [facility]  If 7PM-7AM, please contact overnight-coverage provider If 7AM-7PM, please contact day coverage provider [email or web address]  07/25/2020, 12:57 PM

## 2020-07-25 NOTE — ED Provider Notes (Signed)
[facility] Emergency Department Provider Note    Event Date/Time   First MD Initiated Contact with Patient 07/25/20 1009     (approximate)  I have reviewed the triage vital signs and the nursing notes.   HISTORY  Chief Complaint Clot in Hand    HPI Jesse Chapman is a 55 y.o. male presents to the ER for evaluation of right upper extremity pain.  States he started feeling unwell last night was having abdominal discomfort associated nausea and diaphoresis and had sudden onset of severe right upper extremity pain.  He is never had this before.  Went to urgent care and was found to have findings concerning for acute limb ischemia.  He does not smoke.  No history of blood clots.  Not on blood thinners.    History reviewed. No pertinent past medical history. Family History  Problem Relation Age of Onset  . Healthy Mother   . Healthy Father    Past Surgical History:  Procedure Laterality Date  . GALLBLADDER SURGERY     Patient Active Problem List   Diagnosis Date Noted  . SBO (small bowel obstruction) (HCC) 07/25/2020  . 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 Obesity (BMI 30-39.9) 07/25/2020      Prior to Admission medications   Not on File    Allergies Patient has no known allergies.    Social History Social History   Tobacco Use  . Smoking status: Former Games developer  . Smokeless tobacco: Never Used  Vaping Use  . Vaping Use: Former  Substance Use Topics  . Alcohol use: Never  . Drug use: Never    Review of Systems Patient denies headaches, rhinorrhea, blurry vision, numbness, shortness of breath, chest pain, edema, cough, abdominal pain, nausea, vomiting, diarrhea, dysuria, fevers, rashes or hallucinations unless otherwise stated above in HPI. ____________________________________________   PHYSICAL EXAM:  VITAL SIGNS: Vitals:   07/25/20 1115 07/25/20 1145  BP:    Pulse: 84 81  Resp: (!) 21 (!) 22  Temp:    SpO2: 96% 94%     Constitutional: Alert and oriented.  Eyes: Conjunctivae are normal.  Head: Atraumatic. Nose: No congestion/rhinnorhea. Mouth/Throat: Mucous membranes are moist.   Neck: No stridor. Painless ROM.  Cardiovascular: Normal rate, regular rhythm. Grossly normal heart sounds.  Good peripheral circulation. Respiratory: Normal respiratory effort.  No retractions. Lungs CTAB. Gastrointestinal: Soft and nontender. No distention. No abdominal bruits. No CVA tenderness. Genitourinary: deferred Musculoskeletal: No lower extremity tenderness nor edema.  No joint effusions. Neurologic:  Normal speech and language. No gross focal neurologic deficits are appreciated. No facial droop Skin:  Skin is warm, dry and intact. No rash noted. Psychiatric: Mood and affect are normal. Speech and behavior are normal.  ____________________________________________   LABS (all labs ordered are listed, but only abnormal results are displayed)  Results for orders placed or performed during the hospital encounter of 07/25/20 (from the past 24 hour(s))  CBC with Differential     Status: Abnormal   Collection Time: 07/25/20 10:21 AM  Result Value Ref Range   WBC 18.1 (H) 4.0 - 10.5 K/uL   RBC 5.19 4.22 - 5.81 MIL/uL   Hemoglobin 15.7 13.0 - 17.0 g/dL   HCT 43.1 54.0 - 08.6 %   MCV 85.7 80.0 - 100.0 fL   MCH 30.3 26.0 - 34.0 pg   MCHC 35.3 30.0 - 36.0 g/dL   RDW 76.1 95.0 - 93.2 %   Platelets 363 150 - 400 K/uL  nRBC 0.0 0.0 - 0.2 %   Neutrophils Relative % 90 %   Neutro Abs 16.4 (H) 1.7 - 7.7 K/uL   Lymphocytes Relative 6 %   Lymphs Abs 1.1 0.7 - 4.0 K/uL   Monocytes Relative 3 %   Monocytes Absolute 0.5 0.1 - 1.0 K/uL   Eosinophils Relative 0 %   Eosinophils Absolute 0.0 0.0 - 0.5 K/uL   Basophils Relative 0 %   Basophils Absolute 0.0 0.0 - 0.1 K/uL   Immature Granulocytes 1 %   Abs Immature Granulocytes 0.09 (H) 0.00 - 0.07 K/uL  Resp Panel by RT-PCR (Flu A&B, Covid) Nasopharyngeal Swab     Status:  None   Collection Time: 07/25/20 10:21 AM   Specimen: Nasopharyngeal Swab; Nasopharyngeal(NP) swabs in vial transport medium  Result Value Ref Range   SARS Coronavirus 2 by RT PCR NEGATIVE NEGATIVE   Influenza A by PCR NEGATIVE NEGATIVE   Influenza B by PCR NEGATIVE NEGATIVE  Comprehensive metabolic panel     Status: Abnormal   Collection Time: 07/25/20 10:21 AM  Result Value Ref Range   Sodium 139 135 - 145 mmol/L   Potassium 4.0 3.5 - 5.1 mmol/L   Chloride 101 98 - 111 mmol/L   CO2 24 22 - 32 mmol/L   Glucose, Bld 162 (H) 70 - 99 mg/dL   BUN 23 (H) 6 - 20 mg/dL   Creatinine, Ser 2.67 0.61 - 1.24 mg/dL   Calcium 9.4 8.9 - 12.4 mg/dL   Total Protein 7.7 6.5 - 8.1 g/dL   Albumin 4.5 3.5 - 5.0 g/dL   AST 29 15 - 41 U/L   ALT 41 0 - 44 U/L   Alkaline Phosphatase 58 38 - 126 U/L   Total Bilirubin 1.0 0.3 - 1.2 mg/dL   GFR, Estimated >58 >09 mL/min   Anion gap 14 5 - 15   ____________________________________________  EKG My review and personal interpretation at Time: 10:21   Indication: hand pain  Rate: 70  Rhythm: sinus Axis: normal Other: normal intervals, no stemi ____________________________________________  RADIOLOGY  I personally reviewed all radiographic images ordered to evaluate for the above acute complaints and reviewed radiology reports and findings.  These findings were personally discussed with the patient.  Please see medical record for radiology report.  ____________________________________________   PROCEDURES  Procedure(s) performed:  .Critical Care Performed by: Willy Eddy, MD Authorized by: Willy Eddy, MD   Critical care provider statement:    Critical care time (minutes):  45   Critical care time was exclusive of:  Separately billable procedures and treating other patients   Critical care was necessary to treat or prevent imminent or life-threatening deterioration of the following conditions:  Circulatory failure   Critical care was  time spent personally by me on the following activities:  Development of treatment plan with patient or surrogate, discussions with consultants, evaluation of patient's response to treatment, examination of patient, obtaining history from patient or surrogate, ordering and performing treatments and interventions, ordering and review of laboratory studies, ordering and review of radiographic studies, pulse oximetry, re-evaluation of patient's condition and review of old charts .1-3 Lead EKG Interpretation Performed by: Willy Eddy, MD Authorized by: Willy Eddy, MD     Interpretation: normal     ECG rate:  75   ECG rate assessment: normal     Rhythm: sinus rhythm         Critical Care performed: yes ____________________________________________   INITIAL IMPRESSION / ASSESSMENT AND PLAN /  ED COURSE  Pertinent labs & imaging results that were available during my care of the patient were reviewed by me and considered in my medical decision making (see chart for details).   DDX: Dissection, VTE, vasospasm, sepsis, SBO, mass, COVID  Jesse Chapman is a 55 y.o. who presents to the ED with presentation as described above.  Patient uncomfortable appearing and exam is concerning for acute right upper extremity limb ischemia as I am not able to Doppler signal at the radial or ulnar pulse.  Does have Doppler signal at the [facility].  Based on his presentation CT angiogram ordered due to concern for dissection.  CT imaging did not show any evidence of dissection but with evaluation right upper extremity does appear to have an arterial occlusion of the brachial artery.  I consulted Dr. Gilda Crease of vascular surgery who has evaluated patient at bedside.  I have started him on heparin for anticoagulation.  CT imaging also noted small bowel obstruction.  I consulted with general surgery who did agree and recommend NG tube decompression.  Uncertain as to etiology of occlusion presentation concerning  for thromboembolic phenomena with uncertain source therefore will need hospitalist admission for medical work-up.  Patient has given IV fluids for resuscitation as well as IV narcotic medication.  Will require admission for vascular surgery, surgery management as well as medical work-up.     The patient was evaluated in Emergency Department today for the symptoms described in the history of present illness. He/she was evaluated in the context of the global COVID-19 pandemic, which necessitated consideration that the patient might be at risk for infection with the SARS-CoV-2 virus that causes COVID-19. Institutional protocols and algorithms that pertain to the evaluation of patients at risk for COVID-19 are in a state of rapid change based on information released by regulatory bodies including the CDC and federal and state organizations. These policies and algorithms were followed during the patient's care in the ED.  As part of my medical decision making, I reviewed the following data within the electronic MEDICAL RECORD NUMBER Nursing notes reviewed and incorporated, Labs reviewed, notes from prior ED visits and [place] Controlled Substance Database   ____________________________________________   FINAL CLINICAL IMPRESSION(S) / ED DIAGNOSES  Final diagnoses:  Limb ischemia  SBO (small bowel obstruction) (HCC)      NEW MEDICATIONS STARTED DURING THIS VISIT:  New Prescriptions   No medications on file     Note:  This document was prepared using Dragon voice recognition software and may include unintentional dictation errors.    Willy Eddy, MD 07/25/20 1312

## 2020-07-25 NOTE — ED Notes (Signed)
Received both covid vaccinations

## 2020-07-25 NOTE — ED Triage Notes (Signed)
Patient is being discharged from the Urgent Care and sent to the Emergency Department via EMS. Per Sharol Given PA, patient is in need of higher level of care  to rule out DVT Patient is aware and verbalizes understanding of plan of care. 20 Gauge IV placed in left AC Vitals:   07/25/20 0916  BP: (!) 176/108  Pulse: 81  Resp: (!) 24  Temp: 97.6 F (36.4 C)  SpO2: 98%

## 2020-07-25 NOTE — Consult Note (Signed)
ANTICOAGULATION CONSULT NOTE - Initial Consult  Pharmacy Consult for Heparin Indication: VTE Treatment No Known Allergies  Patient Measurements: Height: 5\' 4"  (162.6 cm) IBW/kg (Calculated) : 59.2 Heparin Dosing Weight: 81.2 kg  Vital Signs: Temp: 97.6 F (36.4 C) (01/18 1006) Temp Source: Oral (01/18 1006) BP: 177/86 (01/18 1006) Pulse Rate: 82 (01/18 1006)  Labs: Recent Labs    07/25/20 1021  HGB 15.7  HCT 44.5  PLT 363  CREATININE 1.07    CrCl cannot be calculated (Unknown ideal weight.).   Medical History: History reviewed. No pertinent past medical history.  Medications:  No PTA APT or AC No AC allergies  Assessment: 55 y.o. male presents to the ER for evaluation of right upper extremity pain as well as paleness of the skin. CT of right upper extremity shows findings consistent with acute arterial occlusion of the distal brachial artery at the elbow, suspect thrombo embolus. Heparin Dosing Weight: 81.2 kg H&H and plts WNL  Goal of Therapy:  Heparin level 0.3-0.7 units/ml Monitor platelets by anticoagulation protocol: Yes   Plan:  Give 5500 units bolus x 1 Start heparin infusion at 1400 units/hr Check anti-Xa level in 6 hours and daily while on heparin Continue to monitor H&H and platelets  44 07/25/2020,11:35 AM

## 2020-07-25 NOTE — Discharge Instructions (Signed)

## 2020-07-25 NOTE — Consult Note (Signed)
ANTICOAGULATION CONSULT NOTE -  Pharmacy Consult for Heparin Indication: VTE Treatment No Known Allergies  Patient Measurements: Height: 5\' 4"  (162.6 cm) Weight: 98 kg (216 lb 0.8 oz) IBW/kg (Calculated) : 59.2 Heparin Dosing Weight: 81.2 kg  Vital Signs: Temp: 97.7 F (36.5 C) (01/18 2038) Temp Source: Oral (01/18 2038) BP: 133/88 (01/18 2038) Pulse Rate: 74 (01/18 2038)  Labs: Recent Labs    07/25/20 1021 07/25/20 1023 07/25/20 2137  HGB 15.7  --   --   HCT 44.5  --   --   PLT 363  --   --   APTT  --  25  --   HEPARINUNFRC  --   --  0.67  CREATININE 1.07  --   --    Estimated Creatinine Clearance: 85.3 mL/min (by C-G formula based on SCr of 1.07 mg/dL).  Medical History: History reviewed. No pertinent past medical history.  Medications:  No PTA APT or AC No AC allergies  Assessment: 55 y.o. male presents to the ER for evaluation of right upper extremity pain as well as paleness of the skin. CT of right upper extremity shows findings consistent with acute arterial occlusion of the distal brachial artery at the elbow, suspect thrombo embolus. Heparin Dosing Weight: 81.2 kg H&H and plts WNL  Goal of Therapy:  Heparin level 0.3-0.7 units/ml Monitor platelets by anticoagulation protocol: Yes   Plan:  Give 5500 units bolus x 1 Start heparin infusion at 1400 units/hr Check anti-Xa level in 6 hours and daily while on heparin Continue to monitor H&H and platelets    0118 2137 HL 0.67, therapeutic.  Will continue Heparin at current rate and recheck HL in am to confirm.  Monitor CBC daily per protocol.  2138 A 07/25/2020,10:39 PM

## 2020-07-25 NOTE — Op Note (Signed)
[facility]  Percutaneous Study/Intervention Procedural Note   Date of Surgery: 07/25/2020,4:51 PM  Surgeon:Willow Reczek, Dolores Lory   Pre-operative Diagnosis: Acute thromboembolism right arm with ischemia right hand  Post-operative diagnosis:  Same  Procedure(s) Performed:  1.  Arch aortogram  2.  Right upper extremity angiography third order catheter placement with additional third order  3.  Mechanical thrombectomy right radial artery  4.  Mechanical thrombectomy right ulnar artery  5.  Percutaneous transluminal angioplasty right ulnar artery to 2 mm  6.  Star close right common femoral artery   Anesthesia: Conscious sedation was administered by the interventional radiology RN under my direct supervision. IV Versed plus fentanyl were utilized. Continuous ECG, pulse oximetry and blood pressure was monitored throughout the entire procedure. Conscious sedation was administered for a total of 1 hour 23 minutes and 7 seconds.  Sheath: 90 cm 6 [place] destination sheath right common femoral retrograde  Contrast: 75 cc   Fluoroscopy Time: 10.8 minutes  Indications: Patient presented to [facility] regional emergency room with a cold pale right hand.  He was initiated on a heparin drip.  CT angiography demonstrated a probable thromboembolism at the level of the distal brachial artery.  Incidentally the CT also identified a small bowel obstruction although no embolisms to the proximal SFA or proximal branches were noted.  Also identified was a small renal infarct.  Given the severity of his right hand symptoms percutaneous thrombectomy was recommended.  Risks and benefits as well as alternative therapies were discussed.  All questions were answered.  The discussion was done with an interpreter via the iPad who was present throughout the entire meeting.  Patient agrees to proceed.  Procedure:  Jesse Chapman a 55 y.o. male who was identified and appropriate procedural  time out was performed.  The patient was then placed supine on the table and prepped and draped in the usual sterile fashion.  Ultrasound was used to evaluate the right common femoral artery.  It was echolucent and pulsatile indicating it is patent .  An ultrasound image was acquired for the permanent record.  A micropuncture needle was used to access the right common femoral artery under direct ultrasound guidance.  The microwire was then advanced under fluoroscopic guidance without difficulty followed by the micro-sheath.  A 0.035 J wire was advanced without resistance and a 5Fr sheath was placed.    Pigtail catheter was then advanced into the ascending aorta and an LAO projection of the arch was obtained.  H1 catheter was then selected and the pigtail catheter was exchanged over a 0.035 advantage wire.  The innominate artery was easily engaged in the H1 catheter negotiated into the proximal subclavian artery.  Hand-injection of contrast then demonstrated the subclavian and axillary arteries as well as the proximal brachial artery.  Wire was reintroduced and the catheter was advanced into the proximal brachial where the mid and distal brachial artery imaging was obtained as well as imaging of the trifurcation.  The CT angiogram had originally demonstrated the occlusion to be in the distal brachial.  However at the time of angiography there was thromboembolism within the proximal radial artery this appears to be a short segment with reconstitution distally there also appear to be thromboembolism within the ulnar artery which essentially was acutely occluded shortly after its origin.  Based on these images an additional 3000 units of heparin was given as the patient came in on a heparin drip.  The advantage wire was reintroduced  and the 5 [place] Pinnacle sheath was exchanged for a 90 cm 6 [place] destination sheath which was then positioned with the tip in the mid brachial artery.  A CXI catheter was then  advanced over the advantage wire and initially the radial artery was selected hand-injection demonstrated that indeed distal to this focal lesion the artery appeared to be patent all the way down to the hand.  A 0.014 run-through wire was then advanced through the CXI catheter and the penumbra CAT Rx catheter was then advanced a single pass was made.  Irrigation of the CAT Rx demonstrated a piece of organized appearing thrombus that measured almost 2 cm in length.  Follow-up imaging by hand-injection through the sheath now demonstrated that the radial artery was patent all the way down filling the first 2 digits of his hand.  CXI catheter and run-through wire were then reintroduced and negotiated into the ulnar artery.  Approximately 6 or 7 passes with the CAT Rx was then performed.  Each time I would [facility] the catheter and it would be removed and then flushed with the return of significant thrombus.  In this artery it was a combination of acute as well as the chronic appearing thrombus.  At 2 separate occasions 300 mcg of nitroglycerin was injected intra-arterially the first aliquot through the actual CAT Rx catheter lacing it from the wrist all the way back to the origin and the second aliquot from the sheath itself.  After multiple passes there was now recanalization of the ulnar artery however there still appeared to be multiple areas of greater than 60% stenosis and I advanced a 2 mm x 220 mm Ultraverse balloon and beginning at the level of the wrist extending all the way back to the brachial artery inflated it to 6 atm for 1 full minute.  Follow-up imaging now demonstrated patency of the ulnar artery with flow all the way down to the wrist and less than 15% residual stenosis.  At this point I elected to terminate the procedure.  The sheath was pulled back into the iliac system and was then exchanged for a 6 French 11 cm sheath oblique view of the groin was obtained a Star close device deployed  successfully.  Findings:   Arch aortogram: Contrast injection in the aortic arch demonstrates a type I arch no evidence of significant atherosclerosis or dissection.  No evidence of aneurysmal changes.  Right upper extremity: The innominate subclavian axillary and proximal two thirds of the brachial artery are widely patent they are normal in appearance.  No aneurysmal degeneration no ulcerative lesions no dissections or other arterial abnormalities are noted.  Distally the brachial artery is widely patent and again normal in appearance which did represent a different finding compared to the CT angiogram.  The radial artery as noted above demonstrated a segment of thromboembolism with an approximately selective imaging demonstrated beyond this lesion it was widely patent.  The ulnar artery was occluded shortly after its origin and remains so down to the wrist.  After a single pass with the CAT Rx the radial artery was now recanalized with less than 5% residual stenosis and wide patency to the hand.  Following multiple passes with the cataracts as well as nitroglycerin administration there was flow return to the ulnar however several areas of greater than 60% narrowing was still identified and therefore a 2 mm balloon angioplasty was performed with a good result and less than 15% residual stenosis.  Summary: Successful thrombectomy of the  right upper extremity with retrieval of thrombus from both the radial and ulnar arteries in association with successful angioplasty of the ulnar artery for salvage of his hand.  Given the appearance of the material that was returned the majority did appear to be organized and therefore cardiac origin is highly likely.     Disposition: Patient was taken to the recovery room in stable condition having tolerated the procedure well.  Jesse Chapman 07/25/2020,4:51 PM

## 2020-07-25 NOTE — ED Triage Notes (Signed)
Pt c/o numbness in right hand and discoloration. Started about 45 minutes ago. He states he was vomiting and sweating shortly before it occurred. Denies chest pain or shortness of breath.

## 2020-07-25 NOTE — ED Triage Notes (Signed)
Pt arrives from [facility] with complaint of possible clot in right hand. At approx 0900 lost cap refill and pulses in right wrist and hand. No feeling below wrist. Per [facility], pt has developed increaseed swelling/bruise en route. Pt arrives  with notable swelling in webbing of right thumb/index finger  Vomitted this AM, pt states this is when symptoms began.   Denies CP, clotting history   196 systolic BP per EMS

## 2020-07-26 ENCOUNTER — Encounter: Payer: Self-pay | Admitting: Vascular Surgery

## 2020-07-26 ENCOUNTER — Inpatient Hospital Stay: Payer: 59

## 2020-07-26 ENCOUNTER — Inpatient Hospital Stay (HOSPITAL_COMMUNITY)
Admit: 2020-07-26 | Discharge: 2020-07-26 | Disposition: A | Discharge status: Home / Self Care | Payer: 59 | Attending: Internal Medicine | Admitting: Internal Medicine

## 2020-07-26 DIAGNOSIS — I998 Other disorder of circulatory system: Secondary | ICD-10-CM | POA: Diagnosis not present

## 2020-07-26 DIAGNOSIS — R93421 Abnormal radiologic findings on diagnostic imaging of right kidney: Secondary | ICD-10-CM | POA: Diagnosis not present

## 2020-07-26 DIAGNOSIS — K56601 Complete intestinal obstruction, unspecified as to cause: Secondary | ICD-10-CM

## 2020-07-26 DIAGNOSIS — K56609 Unspecified intestinal obstruction, unspecified as to partial versus complete obstruction: Secondary | ICD-10-CM

## 2020-07-26 DIAGNOSIS — D72829 Elevated white blood cell count, unspecified: Secondary | ICD-10-CM

## 2020-07-26 DIAGNOSIS — I10 Essential (primary) hypertension: Secondary | ICD-10-CM

## 2020-07-26 DIAGNOSIS — I7090 Unspecified atherosclerosis: Secondary | ICD-10-CM | POA: Diagnosis not present

## 2020-07-26 DIAGNOSIS — E669 Obesity, unspecified: Secondary | ICD-10-CM

## 2020-07-26 LAB — BASIC METABOLIC PANEL
Anion gap: 10 (ref 5–15)
BUN: 27 mg/dL — ABNORMAL HIGH (ref 6–20)
CO2: 26 mmol/L (ref 22–32)
Calcium: 8.6 mg/dL — ABNORMAL LOW (ref 8.9–10.3)
Chloride: 103 mmol/L (ref 98–111)
Creatinine, Ser: 1.09 mg/dL (ref 0.61–1.24)
GFR, Estimated: >60 mL/min (ref >60)
Glucose, Bld: 112 mg/dL — ABNORMAL HIGH (ref 70–99)
Potassium: 3.7 mmol/L (ref 3.5–5.1)
Sodium: 139 mmol/L (ref 135–145)

## 2020-07-26 LAB — CBC
HCT: 35.6 % — ABNORMAL LOW (ref 39.0–52.0)
Hemoglobin: 12.5 g/dL — ABNORMAL LOW (ref 13.0–17.0)
MCH: 30.2 pg (ref 26.0–34.0)
MCHC: 35.1 g/dL (ref 30.0–36.0)
MCV: 86 fL (ref 80.0–100.0)
Platelets: 341 10*3/uL (ref 150–400)
RBC: 4.14 MIL/uL — ABNORMAL LOW (ref 4.22–5.81)
RDW: 11.9 % (ref 11.5–15.5)
WBC: 13 10*3/uL — ABNORMAL HIGH (ref 4.0–10.5)
nRBC: 0 % (ref 0.0–0.2)

## 2020-07-26 LAB — ECHOCARDIOGRAM COMPLETE
AR max vel: 2.14 cm2
AV Area VTI: 2.28 cm2
AV Area mean vel: 2.11 cm2
AV Mean grad: 4 mmHg
AV Peak grad: 7.4 mmHg
Ao pk vel: 1.36 m/s
Area-P 1/2: 4.31 cm2
Height: 64 in
S' Lateral: 3 cm
Weight: 3456.81 oz

## 2020-07-26 LAB — LACTIC ACID, PLASMA
Lactic Acid, Venous: 1.7 mmol/L (ref 0.5–1.9)
Lactic Acid, Venous: 1.4 mmol/L (ref 0.5–1.9)

## 2020-07-26 LAB — TYPE AND SCREEN
ABO/RH(D): B POS
Antibody Screen: NEGATIVE

## 2020-07-26 LAB — HEPARIN LEVEL (UNFRACTIONATED): Heparin Unfractionated: 0.61 IU/mL (ref 0.30–0.70)

## 2020-07-26 MED ORDER — METOPROLOL TARTRATE 5 MG/5ML IV SOLN
2.5000 mg | Freq: Three times a day (TID) | INTRAVENOUS | Status: DC
  Administered 2020-07-26 – 2020-07-27 (×4): 2.5 mg via INTRAVENOUS
  Filled 2020-07-26 (×4): qty 5

## 2020-07-26 MED ORDER — DIATRIZOATE MEGLUMINE & SODIUM 66-10 % PO SOLN
90.0000 mL | Freq: Once | ORAL | Status: AC
  Administered 2020-07-26: 90 mL via NASOGASTRIC

## 2020-07-26 MED ORDER — SODIUM CHLORIDE 0.9 % IV SOLN: INTRAVENOUS | Status: DC

## 2020-07-26 MED ORDER — KCL IN DEXTROSE-NACL 20-5-0.9 MEQ/L-%-% IV SOLN
INTRAVENOUS | Status: DC
  Filled 2020-07-26 (×8): qty 1000

## 2020-07-26 NOTE — Progress Notes (Signed)
SURGICAL PROGRESS NOTE   Hospital Day(s): 1.   Post op day(s): 1 Day Post-Op.   Interval History: Patient seen and examined, no acute events or new complaints overnight. Patient reports feeling better today.  He denies any pain.  He reports that he is right upper extremity feels great, normal.  He denies spontaneous abdominal pain.  He denies any nausea or vomiting.  He denies passing gas or having bowel movement.  He does endorses feeling the urgency of passing flatus but his care to soil.  Vital signs in last 24 hours: [min-max] current  Temp:  [97.6 F (36.4 C)-98.2 F (36.8 C)] 98.1 F (36.7 C) (01/19 0739) Pulse Rate:  [72-100] 76 (01/19 0739) Resp:  [12-24] 16 (01/19 0739) BP: (109-194)/(78-108) 163/89 (01/19 0739) SpO2:  [93 %-98 %] 97 % (01/19 0739) Weight:  [98 kg] 98 kg (01/18 1205)     Height: 5\' 4"  (162.6 cm) Weight: 98 kg BMI (Calculated): 37.07   Physical Exam:  Constitutional: alert, cooperative and no distress  Respiratory: breathing non-labored at rest  Cardiovascular: regular rate and sinus rhythm  Gastrointestinal: soft, mild-tender in right lower quadrant, and mild-distended  Labs:  CBC Latest Ref Rng & Units 07/26/2020 07/25/2020 08/31/2019  WBC 4.0 - 10.5 K/uL 13.0(H) 18.1(H) 14.3(H)  Hemoglobin 13.0 - 17.0 g/dL 12.5(L) 15.7 14.1  Hematocrit 39.0 - 52.0 % 35.6(L) 44.5 39.8  Platelets 150 - 400 K/uL 341 363 395   CMP Latest Ref Rng & Units 07/26/2020 07/25/2020 08/31/2019  Glucose 70 - 99 mg/dL 09/02/2019) 242(P) 536(R)  BUN 6 - 20 mg/dL 443(X) 54(M) 18  Creatinine 0.61 - 1.24 mg/dL 08(Q 7.61 9.50  Sodium 135 - 145 mmol/L 139 139 137  Potassium 3.5 - 5.1 mmol/L 3.7 4.0 3.9  Chloride 98 - 111 mmol/L 103 101 102  CO2 22 - 32 mmol/L 26 24 29   Calcium 8.9 - 10.3 mg/dL 9.32) 9.4 9.1  Total Protein 6.5 - 8.1 g/dL - 7.7 -  Total Bilirubin 0.3 - 1.2 mg/dL - 1.0 -  Alkaline Phos 38 - 126 U/L - 58 -  AST 15 - 41 U/L - 29 -  ALT 0 - 44 U/L - 41 -    Imaging studies:  No new pertinent imaging studies   Assessment/Plan:  55 y.o. male with small bowel obstruction and right upper extremity acute limb ischemia 1 Day Post-Op s/p thrombectomy and angioplasty.  Patient today without any clinical deterioration.  Actually he is improving clinically.  His right upper extremity is warm and there is no pain.  He also has strength 5/5 in all his extremities.  Regarding the small bowel obstruction there is no clinical deterioration.  Patient is passing gas.  Today there has been no pain.  He does has mild tenderness on deep palpation in the right lower quadrant.  He feels the origins of passing gas but has not been able to.  I discussed with him that if he feels to passing gas not to hold it.  I also encouraged him to ambulate.  I will order an x-ray to follow-up his gas bowel pattern.  CBC shows improvement of the white blood cell count.  I will repeat lactic acid just to see a trend.  At the moment of my evaluation there seems to be no urgency to take this patient to the operating room for diagnostic laparoscopy.  I will follow-up later today with serial physical exam and follow-up of the imaging and labs.  I had  a long discussion with the patient about the different scenarios including continued observation if he continues to improve versus undergoing surgical management if there is any concern of bowel ischemia.  Agreed to continue heparin drip until decision for surgery is ruled out.  I will defer to primary team for further work-up to evaluate the etiology of this ischemic event.  I will follow closely.   Gae Gallop, MD

## 2020-07-26 NOTE — Consult Note (Signed)
ANTICOAGULATION CONSULT NOTE -  Pharmacy Consult for Heparin Indication: VTE Treatment No Known Allergies  Patient Measurements: Height: 5\' 4"  (162.6 cm) Weight: 98 kg (216 lb 0.8 oz) IBW/kg (Calculated) : 59.2 Heparin Dosing Weight: 81.2 kg  Vital Signs: Temp: 98 F (36.7 C) (01/19 0529) Temp Source: Oral (01/19 0529) BP: 154/83 (01/19 0529) Pulse Rate: 72 (01/19 0529)  Labs: Recent Labs    07/25/20 1021 07/25/20 1023 07/25/20 2137 07/26/20 0429  HGB 15.7  --   --  12.5*  HCT 44.5  --   --  35.6*  PLT 363  --   --  341  APTT  --  25  --   --   HEPARINUNFRC  --   --  0.67 0.61  CREATININE 1.07  --   --  1.09   Estimated Creatinine Clearance: 83.8 mL/min (by C-G formula based on SCr of 1.09 mg/dL).  Medical History: History reviewed. No pertinent past medical history.  Medications:  No PTA APT or AC No AC allergies  Assessment: 55 y.o. male presents to the ER for evaluation of right upper extremity pain as well as paleness of the skin. CT of right upper extremity shows findings consistent with acute arterial occlusion of the distal brachial artery at the elbow, suspect thrombo embolus. Heparin Dosing Weight: 81.2 kg H&H and plts WNL  Goal of Therapy:  Heparin level 0.3-0.7 units/ml Monitor platelets by anticoagulation protocol: Yes   Plan:  Give 5500 units bolus x 1 Start heparin infusion at 1400 units/hr Check anti-Xa level in 6 hours and daily while on heparin Continue to monitor H&H and platelets    0118 2137 HL 0.67, therapeutic.  Will continue Heparin at current rate and recheck HL in am to confirm.  Monitor CBC daily per protocol.  0119 0429 HL 0.61, therapeutic x 2.  Will continue Heparin at current rate and recheck HL and CBC daily.  PLTs stable, H/H worse (likely dilutional)  2138, Ariyan Sinnett A 07/26/2020,6:44 AM

## 2020-07-26 NOTE — Progress Notes (Signed)
Gastrografin complete at 2115. Xray notified.

## 2020-07-26 NOTE — Progress Notes (Signed)
*  PRELIMINARY RESULTS* Echocardiogram 2D Echocardiogram has been performed.  Cristela Blue 07/26/2020, 12:01 PM

## 2020-07-26 NOTE — Progress Notes (Signed)
SURGICAL FOLLOW UP NOTE  Hospital Day(s): 1.   Post op day(s): 1 Day Post-Op.   Interval History: Patient seen and examined again and reported that he is feeling better.  He reported that he passed gas after he was able to ambulate.  Denies nausea or vomiting.  Vital signs in last 24 hours: [min-max] current  Temp:  [97.7 F (36.5 C)-98.3 F (36.8 C)] 98.3 F (36.8 C) (01/19 1548) Pulse Rate:  [72-79] 75 (01/19 1548) Resp:  [16-20] 20 (01/19 1548) BP: (133-165)/(83-92) 165/84 (01/19 1548) SpO2:  [95 %-98 %] 98 % (01/19 1548)     Height: 5\' 4"  (162.6 cm) Weight: 98 kg BMI (Calculated): 37.07   Physical Exam:  Constitutional: alert, cooperative and no distress  Respiratory: breathing non-labored at rest  Cardiovascular: regular rate and sinus rhythm  Gastrointestinal: soft, mild-tender, and non-distended  Labs:  CBC Latest Ref Rng & Units 07/26/2020 07/25/2020 08/31/2019  WBC 4.0 - 10.5 K/uL 13.0(H) 18.1(H) 14.3(H)  Hemoglobin 13.0 - 17.0 g/dL 12.5(L) 15.7 14.1  Hematocrit 39.0 - 52.0 % 35.6(L) 44.5 39.8  Platelets 150 - 400 K/uL 341 363 395   CMP Latest Ref Rng & Units 07/26/2020 07/25/2020 08/31/2019  Glucose 70 - 99 mg/dL 09/02/2019) 440(H) 474(Q)  BUN 6 - 20 mg/dL 595(G) 38(V) 18  Creatinine 0.61 - 1.24 mg/dL 56(E 3.32 9.51  Sodium 135 - 145 mmol/L 139 139 137  Potassium 3.5 - 5.1 mmol/L 3.7 4.0 3.9  Chloride 98 - 111 mmol/L 103 101 102  CO2 22 - 32 mmol/L 26 24 29   Calcium 8.9 - 10.3 mg/dL 8.84) 9.4 9.1  Total Protein 6.5 - 8.1 g/dL - 7.7 -  Total Bilirubin 0.3 - 1.2 mg/dL - 1.0 -  Alkaline Phos 38 - 126 U/L - 58 -  AST 15 - 41 U/L - 29 -  ALT 0 - 44 U/L - 41 -    Imaging studies: I personally evaluated the images of the new abdominal x-ray.  There is improved small bowel dilation.  There is still few loops of small bowel dilated.  There is no free air.  Assessment/Plan:  Patient reevaluated this afternoon.  Clinically he is improving with less pain.  He has not had any  nausea or vomiting.  He has been with NGT to suction.  New x-ray with improvement of the gas pattern.  White blood cell trending down.  Lactic acid trending down.  These are great signs that goes against progressive ischemia.  I will order Gastrografin study to see if there is no complete obstruction and the Gastrografin passed to the large intestine before removing the NGT.  I encouraged the patient to continue ambulation.  This follow-up encounter was more than 30 minutes most of the time counseling the patient and coordinating plan of care.  , MD

## 2020-07-26 NOTE — Evaluation (Signed)
Physical Therapy Evaluation Patient Details Name: Jesse Chapman MRN: [id number] DOB: 11-02-1965 Today's Date: 07/26/2020   History of Present Illness  Patient is a 55 year old male who presents to ED for R arm pain, numbness, and hand discoloration beginning ~ 5am on 07/25/20. Patient is spanish speaking. Abdominal pain and arm pain reported. He works in [employer]. Was found to have a small bowel obstruction with R upper quadrant arterial occlusion. NG tube placed and thrombectomy to R radial and ulnar artery with star close right common femoral artery.    Clinical Impression  Patient is a very pleasant 55 year old male who presents with generalized weakness and limited capacity for functional mobility. Prior to hospital admission, pt was independent working as a Agricultural engineer and lives with his daughter sometimes in a one story home with 6-7 stairs to enter.  Patient is in bed upon PT entering, nursing called to disconnect NG tube. Patient is able to transition EOB without assistance and perform seated interventions without LOB. Standing and ambulation tolerated well although patient does demonstrate slow velocity and small step length fatiguing by end of lap. Patient is not yet at baseline functioning with diffuse LE weakness noted.   Patient brought to chair with needs met and nursing notified of need to re-hook NG tube. Pt would benefit from skilled PT to address noted impairments and functional limitations (see below for any additional details).  Upon hospital discharge, pt would benefit from HHPT and supervision OOB to return to PLOF and reduce risk of falls.      Follow Up Recommendations Home health PT;Supervision for mobility/OOB    Equipment Recommendations  None recommended by PT    Recommendations for Other Services OT consult     Precautions / Restrictions Precautions Precautions: Fall Restrictions Weight Bearing Restrictions: No      Mobility  Bed  Mobility Overal bed mobility: Independent             General bed mobility comments: supine to sit with pushing off of UE, no need for assistance    Transfers Overall transfer level: Needs assistance Equipment used: None Transfers: Sit to/from Stand Sit to Stand: Min guard;Supervision         General transfer comment: STS with CGA/supervision no need for UE support.  Ambulation/Gait Ambulation/Gait assistance: Min guard;Supervision Gait Distance (Feet): 160 Feet Assistive device: IV Pole Gait Pattern/deviations: Step-through pattern;Shuffle;Narrow base of support Gait velocity: decreased   General Gait Details: Patient initially ambulates with short steps and slow cadence but does improve towards a more normal rhythm as he progresses, still has narrow BOS and small step length however.  Stairs            Wheelchair Mobility    Modified Rankin (Stroke Patients Only)       Balance Overall balance assessment: Needs assistance Sitting-balance support: No upper extremity supported;Feet supported Sitting balance-Leahy Scale: Good Sitting balance - Comments: able to assist with reaching and LE movements without LOB   Standing balance support: No upper extremity supported;During functional activity Standing balance-Leahy Scale: Fair Standing balance comment: able to ambulate short durations without UE support, pushes IV pole for longer duration.                             Pertinent Vitals/Pain Pain Assessment: No/denies pain (discomfort from NG suction)    Home Living Family/patient expects to be discharged to:: Private residence Living Arrangements:  Children (recently seperated from wife, daughter stays with him sometimes) Available Help at Discharge: Family Type of Home: House Home Access: Stairs to enter Entrance Stairs-Rails: Psychiatric nurse of Steps: 6-7 Home Layout: One level Home Equipment: Shower seat Additional  Comments: Patient is independent without AD at baseline. Reports he is recently seperated from wife so has daughter intermittently with him but can have family present if needed.    Prior Function Level of Independence: Independent         Comments: Patient works as a Music therapist.     Hand Dominance        Extremity/Trunk Assessment   Upper Extremity Assessment Upper Extremity Assessment: Defer to OT evaluation    Lower Extremity Assessment Lower Extremity Assessment: Generalized weakness (LE grossly 4+/5 bilaterally, avoided R hip resistance due to recent closure.)    Cervical / Trunk Assessment Cervical / Trunk Assessment: Normal  Communication   Communication: No difficulties  Cognition Arousal/Alertness: Awake/alert Behavior During Therapy: WFL for tasks assessed/performed Overall Cognitive Status: Within Functional Limits for tasks assessed                                 General Comments: A and O x 4      General Comments General comments (skin integrity, edema, etc.): Patient appears well groomed and nourished.    Exercises Other Exercises Other Exercises: Patient educated on role of PT in acute care setting, safe mobility and transfers, and fall risk reduction. Seated and standing mobility/stability tolerated well.   Assessment/Plan    PT Assessment Patient needs continued PT services  PT Problem List Decreased strength;Decreased activity tolerance;Decreased balance;Decreased mobility       PT Treatment Interventions DME instruction;Gait training;Stair training;Functional mobility training;Therapeutic activities;Patient/family education;Cognitive remediation;Neuromuscular re-education;Balance training;Therapeutic exercise    PT Goals (Current goals can be found in the Care Plan section)  Acute Rehab PT Goals Patient Stated Goal: to return home PT Goal Formulation: With patient Time For Goal Achievement: 08/09/20 Potential  to Achieve Goals: Fair    Frequency Min 2X/week   Barriers to discharge   would benefit from HHPT to return to PLOF    Co-evaluation               AM-PAC PT "6 Clicks" Mobility  Outcome Measure Help needed turning from your back to your side while in a flat bed without using bedrails?: None Help needed moving from lying on your back to sitting on the side of a flat bed without using bedrails?: A Little Help needed moving to and from a bed to a chair (including a wheelchair)?: A Little Help needed standing up from a chair using your arms (e.g., wheelchair or bedside chair)?: A Little Help needed to walk in hospital room?: A Little Help needed climbing 3-5 steps with a railing? : A Little 6 Click Score: 19    End of Session Equipment Utilized During Treatment: Gait belt Activity Tolerance: Patient tolerated treatment well Patient left: in chair;with call bell/phone within reach;with chair alarm set Nurse Communication: Mobility status (sitting in chair, need NG tube re-attachment) PT Visit Diagnosis: Unsteadiness on feet (R26.81);Other abnormalities of gait and mobility (R26.89);Muscle weakness (generalized) (M62.81)    Time: 1610-9604 PT Time Calculation (min) (ACUTE ONLY): 24 min   Charges:   PT Evaluation $PT Eval Moderate Complexity: 1 Mod PT Treatments $Therapeutic Activity: 8-22 mins      Janna Arch, PT,  DPT   07/26/2020, 1:52 PM

## 2020-07-26 NOTE — Progress Notes (Signed)
PROGRESS NOTE    Marko StaiJulio C Romero Najera  JXB:[id number] DOB: 10-12-1965 DOA: 07/25/2020 PCP: Patient, No Pcp Per    Chief Complaint  Patient presents with  . Clot in Hand    Brief Narrative:  Patient 55 year old gentleman no prior medical history, poor outpatient follow-up not on any antimedications presented to the ED with right arm pain and numbness, hand discoloration started the morning of admission at 5 AM 07/25/2020.  Patient also with associated abdominal pain of the lower abdomen with some emesis and nausea.  CT abdomen and pelvis done concerning for small bowel obstruction.  CT angiogram right upper extremity done concerning for acute arterial occlusion of the distal brachial artery at the elbow, suspect thromboembolism.  Patient seen in consultation by general surgery as well as vascular surgery.  Due to concerns for right upper extremity ischemia patient underwent thrombectomy per vascular surgery 07/25/2020.   Assessment & Plan:   Principal Problem:   Arterial occlusion Active Problems:   Ischemia of right upper extremity   SBO (small bowel obstruction) (HCC)   Obesity (BMI 30-39.9)   HTN (hypertension)   Leukocytosis   Abnormal finding on diagnostic imaging of right kidney   1 right upper extremity ischemia/acute thromboembolism right upper extremity Questionable etiology.  Concern likely cardiac etiology.  Patient seen by vascular surgery patient underwent mechanical thrombectomy of the right radial and right ulnar artery with percutaneous transluminal angioplasty of the right ulnar artery to 2 mm on 07/25/2020.  Patient with clinical improvement with improvement with right upper extremity pain, numbness and mobility.  Currently on heparin drip.  Check a 2D echo.  Per vascular surgery once no need for surgery could likely transition to Eliquis.  Per vascular surgery.  2.  Small bowel obstruction Patient presented with abdominal pain, CT abdomen and pelvis consistent with a  small bowel obstruction.  Patient passing flatus.  No bowel movement.  Denies any significant abdominal pain.  General surgery following and abdominal films done this morning.  Currently on heparin drip in case patient may need surgery.  Per general surgery.  3.  Hypertension Blood pressure noted to be elevated.  Patient with no prior history of hypertension.  Placed on IV Lopressor for now as patient currently n.p.o. secondary to problem #2.  Will need outpatient follow-up with primary care.  TOC consulted to help with PCP.  4.  Leukocytosis Likely secondary to problem #1 and #2.  Likely reactive.  Patient currently afebrile.  Patient with no respiratory symptoms.  Leukocytosis trending down.  Hold off on antibiotics at this time.  Supportive care.  Follow.  5.  Right mid kidney with wedge-shaped area of decreased enhancement Per CT concerning for infarct versus focal pyelonephritis.  Likely secondary to thromboembolism.  Renal function stable.  Patient with no CVA tenderness.  Check a UA with cultures and sensitivities.  Check a 2D echo.  Currently on anticoagulation with IV heparin which we will continue for now and when it is deemed no further surgery needed would likely transition to Eliquis.  Outpatient follow-up.  6.  Obesity BMI 37.09 kg/m2   DVT prophylaxis: Heparin Code Status: Full Family Communication: Updated patient and daughter at bedside. Disposition:   Status is: Inpatient    Dispo: The patient is from: Home              Anticipated d/c is to: Home              Anticipated d/c date is: 3 to  4 days per              Patient currently with NG tube in place, small bowel obstruction, status post thrombectomy, not stable for discharge.       Consultants:   General surgery: Dr. Hazle Quant 07/25/2020  Vascular surgery: Dr. Gilda Crease 07/25/2020  Procedures:   CT angiogram right upper extremity 07/25/2020  CT angiogram chest abdomen and pelvis 07/25/2020  Abdominal  films 07/26/2020  2D echo pending 07/26/2020  Upper extremity angiogram 07/25/2020  Heart aortogram/right upper extremity angiography third order catheter placement with additional third order/mechanical thrombectomy right radial artery/mechanical thrombectomy right ulnar artery/percutaneous transluminal angioplasty right ulnar artery to 2 mm/StarClose right common femoral artery per Dr.Schnier, vascular surgery 07/25/2020  Antimicrobials:   None   Subjective: Feeling great. Denies any abdominal pain. No nausea, no emesis. Passing alt of gas but no BM. States general surgery stated RN to ambvuklate him tyoday. No CP. No RUE pain or numbness.   Objective: Vitals:   07/26/20 0529 07/26/20 0739 07/26/20 1101 07/26/20 1548  BP: (!) 154/83 (!) 163/89 (!) 151/90 (!) 165/84  Pulse: 72 76 79 75  Resp: 16 16 16 20   Temp: 98 F (36.7 C) 98.1 F (36.7 C) 98.2 F (36.8 C) 98.3 F (36.8 C)  TempSrc: Oral Oral Oral Oral  SpO2: 96% 97% 96% 98%  Weight:      Height:        Intake/Output Summary (Last 24 hours) at 07/26/2020 1608 Last data filed at 07/26/2020 1131 Gross per 24 hour  Intake 59.2 ml  Output 700 ml  Net -640.8 ml   Filed Weights   07/25/20 1205  Weight: 98 kg    Examination:  General exam: NAD.  NG tube in place. Respiratory system: Clear to auscultation anterior lung fields with. Respiratory effort normal. Cardiovascular system: S1 & S2 heard, RRR. No JVD, murmurs, rubs, gallops or clicks. No pedal edema. Gastrointestinal system: Abdomen is nondistended, soft, some tenderness to palpation in the right lower quadrant with deep palpation.  No rebound.  No guarding.  Positive bowel sounds. Central nervous system: Alert and oriented. No focal neurological deficits. Extremities: Symmetric 5 x 5 power. Skin: No rashes, lesions or ulcers Psychiatry: Judgement and insight appear normal. Mood & affect appropriate.     Data Reviewed: I have personally reviewed following labs  and imaging studies  CBC: Recent Labs  Lab 07/25/20 1021 07/26/20 0429  WBC 18.1* 13.0*  NEUTROABS 16.4*  --   HGB 15.7 12.5*  HCT 44.5 35.6*  MCV 85.7 86.0  PLT 363 341    Basic Metabolic Panel: Recent Labs  Lab 07/25/20 1021 07/26/20 0429  NA 139 139  K 4.0 3.7  CL 101 103  CO2 24 26  GLUCOSE 162* 112*  BUN 23* 27*  CREATININE 1.07 1.09  CALCIUM 9.4 8.6*    GFR: Estimated Creatinine Clearance: 83.8 mL/min (by C-G formula based on SCr of 1.09 mg/dL).  Liver Function Tests: Recent Labs  Lab 07/25/20 1021  AST 29  ALT 41  ALKPHOS 58  BILITOT 1.0  PROT 7.7  ALBUMIN 4.5    CBG: No results for input(s): GLUCAP in the last 168 hours.   Recent Results (from the past 240 hour(s))  Resp Panel by RT-PCR (Flu A&B, Covid) Nasopharyngeal Swab     Status: None   Collection Time: 07/25/20 10:21 AM   Specimen: Nasopharyngeal Swab; Nasopharyngeal(NP) swabs in vial transport medium  Result Value Ref Range Status  SARS Coronavirus 2 by RT PCR NEGATIVE NEGATIVE Final    Comment: (NOTE) SARS-CoV-2 target nucleic acids are NOT DETECTED.  The SARS-CoV-2 RNA is generally detectable in upper respiratory specimens during the acute phase of infection. The lowest concentration of SARS-CoV-2 viral copies this assay can detect is 138 copies/mL. A negative result does not preclude SARS-Cov-2 infection and should not be used as the sole basis for treatment or other patient management decisions. A negative result may occur with  improper specimen collection/handling, submission of specimen other than nasopharyngeal swab, presence of viral mutation(s) within the areas targeted by this assay, and inadequate number of viral copies(<138 copies/mL). A negative result must be combined with clinical observations, patient history, and epidemiological information. The expected result is Negative.  Fact Sheet for Patients:  BloggerCourse.com  Fact Sheet for  Healthcare Providers:  SeriousBroker.it  This test is no t yet approved or cleared by the [place] FDA and  has been authorized for detection and/or diagnosis of SARS-CoV-2 by FDA under an Emergency Use Authorization (EUA). This EUA will remain  in effect (meaning this test can be used) for the duration of the COVID-19 declaration under Section 564(b)(1) of the Act, 21 U.S.C.section 360bbb-3(b)(1), unless the authorization is terminated  or revoked sooner.       Influenza A by PCR NEGATIVE NEGATIVE Final   Influenza B by PCR NEGATIVE NEGATIVE Final    Comment: (NOTE) The Xpert Xpress SARS-CoV-2/FLU/RSV plus assay is intended as an aid in the diagnosis of influenza from Nasopharyngeal swab specimens and should not be used as a sole basis for treatment. Nasal washings and aspirates are unacceptable for Xpert Xpress SARS-CoV-2/FLU/RSV testing.  Fact Sheet for Patients: BloggerCourse.com  Fact Sheet for Healthcare Providers: SeriousBroker.it  This test is not yet approved or cleared by the [place] FDA and has been authorized for detection and/or diagnosis of SARS-CoV-2 by FDA under an Emergency Use Authorization (EUA). This EUA will remain in effect (meaning this test can be used) for the duration of the COVID-19 declaration under Section 564(b)(1) of the Act, 21 U.S.C. section 360bbb-3(b)(1), unless the authorization is terminated or revoked.  Performed at [facility], [address] [place], [place] [address]          Radiology Studies: CT ANGIO UP EXTREM RIGHT W &/OR WO CONTRAST  Result Date: 07/25/2020 CLINICAL DATA:  Concern for right upper extremity acute ischemia, diminished radial pulse EXAM: CT ANGIOGRAPHY UPPER RIGHT EXTREMITY TECHNIQUE: Multidetector CT imaging performed of the right upper extremity as a CTA exam. Multiplanar reconstruction images and MIPS were  obtained to evaluate the vascular anatomy. CONTRAST:  OMNIPAQUE IOHEXOL 350 MG/ML SOLN COMPARISON:  07/25/2020 FINDINGS: Vascular: Visualized right subclavian and axillary arteries are patent. In the upper arm the brachial artery is patent. No proximal dissection. At the elbow, there is abrupt truncation of the brachial artery at or very close to the bifurcation of the radial and ulnar arteries compatible with acute arterial occlusion, appearance suggest thromboembolism. Very minimal reconstitution of the forearm and wrist arterial system to confirm distal vasculature patency. Nonvascular: No focal soft tissue abnormality or swelling of the upper extremity. No acute osseous finding. Review of the MIP images confirms the above findings. IMPRESSION: Findings consistent with acute arterial occlusion of the distal brachial artery at the elbow, suspect thrombo embolus. These results were called by telephone at the time of interpretation on 07/25/2020 at 11:49 am to provider Willy Eddy , who verbally acknowledged these results.  Electronically Signed   By: Judie Petit.  Shick M.D.   On: 07/25/2020 11:50   PERIPHERAL VASCULAR CATHETERIZATION  Result Date: 07/25/2020 See Op note  DG Abd 2 Views  Result Date: 07/26/2020 CLINICAL DATA:  Bowel obstruction EXAM: ABDOMEN - 2 VIEW COMPARISON:  CT angio 07/25/2020 FINDINGS: Nasogastric tube coiled in proximal stomach. Few mildly prominent air-filled loops of small bowel in the LEFT mid abdomen. These have slightly decreased in caliber and number since previous exam. Excreted contrast within urinary bladder. No bowel wall thickening or free air. Minimal atelectasis at lung bases. Sclerotic focus at the LEFT femoral head unchanged, nonspecific Osseous structures otherwise unremarkable. IMPRESSION: Decreased small bowel distension since prior exam. Electronically Signed   By: Ulyses Southward M.D.   On: 07/26/2020 08:31   CT Angio Chest/Abd/Pel for Dissection W and/or Wo  Contrast  Result Date: 07/25/2020 CLINICAL DATA:  Numbness in right hand.  Vomiting and sweating. EXAM: CT ANGIOGRAPHY CHEST, ABDOMEN AND PELVIS TECHNIQUE: Non-contrast CT of the chest was initially obtained. Multidetector CT imaging through the chest, abdomen and pelvis was performed using the standard protocol during bolus administration of intravenous contrast. Multiplanar reconstructed images and MIPs were obtained and reviewed to evaluate the vascular anatomy. CONTRAST:  OMNIPAQUE IOHEXOL 350 MG/ML SOLN COMPARISON:  None. FINDINGS: CTA CHEST FINDINGS Cardiovascular: Heart size appears normal. No pericardial effusion. No signs of thoracic aortic dissection. Aortic atherosclerosis. Several irregular noncalcified atherosclerotic plaques are identified arising off the wall of the transverse aortic arch, image 26/4 and image 27/4. Mediastinum/Nodes: No enlarged mediastinal, hilar, or axillary lymph nodes. Thyroid gland, trachea, and esophagus demonstrate no significant findings. Lungs/Pleura: No pleural effusion. No airspace consolidation. Mild ground-glass attenuation is identified in both lower lobes and right middle lobe. Musculoskeletal: No chest wall abnormality. No acute or significant osseous findings. Review of the MIP images confirms the above findings. CTA ABDOMEN AND PELVIS FINDINGS VASCULAR Aorta: Normal caliber aorta without aneurysm, dissection, vasculitis or significant stenosis. Celiac: Mild stenosis at the origin of the left gastric artery which arises directly off the aorta (proximal to the celiac artery). There is also mild stenosis of the origin of the celiac artery. SMA: Patent without evidence of aneurysm, dissection, vasculitis or significant stenosis. Renals: Both renal arteries are patent without evidence of aneurysm, dissection, vasculitis, fibromuscular dysplasia or significant stenosis. IMA: Patent without evidence of aneurysm, dissection, vasculitis or significant stenosis.  Inflow: Patent without evidence of aneurysm, dissection, vasculitis or significant stenosis. Veins: No obvious venous abnormality within the limitations of this arterial phase study. Review of the MIP images confirms the above findings. NON-VASCULAR Hepatobiliary: No focal liver abnormality is seen. Status post cholecystectomy. No biliary dilatation. Pancreas: Unremarkable. No pancreatic ductal dilatation or surrounding inflammatory changes. Spleen: Normal in size without focal abnormality. Adrenals/Urinary Tract: Normal appearance of the adrenal glands. Wedge-shaped area of decreased enhancement is identified within the lateral cortex of the right mid kidney, image 94/4. Left kidney is unremarkable. No hydronephrosis identified bilaterally. Urinary bladder is normal. Stomach/Bowel: Small hiatal hernia. The stomach appears distended. The small bowel loops are abnormally dilated with multiple air-fluid levels. These measure up to 3.1 cm. There is a transition point to decreased caliber terminal ileum within the lower abdomen, image 153/4. No signs of pneumatosis, bowel perforation or abscess. Scattered colonic diverticula noted. Lymphatic: No abdominopelvic adenopathy. Reproductive: Prostate is unremarkable. Other: No free fluid or fluid collections. Musculoskeletal: No acute or significant osseous findings. Review of the MIP images confirms the above findings. IMPRESSION: 1.  No evidence for aortic dissection. There is aortic atherosclerosis with several small irregular noncalcified atherosclerotic plaques arising off the wall of the transverse aortic arch. 2. Examination is positive for small bowel obstruction. Transition point to decreased caliber terminal ileum within the lower abdomen. Long segment of terminal ileum stricture may reflect sequelae of inflammatory/infectious enteritis versus ischemic stricture. The SMA and its branches however appears patent. No signs of pneumatosis or portal venous gas. 3.  Wedge-shaped area of decreased enhancement within the lateral cortex of the right mid kidney concerning for infarct versus focal pyelonephritis. Aortic Atherosclerosis (ICD10-I70.0). Critical Value/emergent results were called by telephone at the time of interpretation on 07/25/2020 at 11:21 am to provider Willy EddyPATRICK ROBINSON , who verbally acknowledged these results. Electronically Signed   By: Signa Kellaylor  Stroud M.D.   On: 07/25/2020 11:21        Scheduled Meds: . atorvastatin  10 mg Oral q1800  . metoprolol tartrate  2.5 mg Intravenous Q8H  . sodium chloride flush  3 mL Intravenous Q12H   Continuous Infusions: . sodium chloride    . dextrose 5 % and 0.9 % NaCl with KCl 20 mEq/L 125 mL/hr at 07/26/20 1147  . heparin 1,400 Units/hr (07/26/20 0531)     LOS: 1 day    Time spent: 40 minutes    Ramiro Harvestaniel Mckinsey Keagle, MD [facility]   To contact the attending provider between 7A-7P or the covering provider during after hours 7P-7A, please log into the web site [email or web address] and access using universal [place] password for that web site. If you do not have the password, please call the hospital operator.  07/26/2020, 4:08 PM

## 2020-07-26 NOTE — Evaluation (Signed)
Occupational Therapy Evaluation Patient Details Name: Jesse Chapman MRN: [id number] DOB: 15-Dec-1965 Today's Date: 07/26/2020    History of Present Illness Patient is a 55 year old male who presents to ED for R arm pain, numbness, and hand discoloration beginning ~ 5am on 07/25/20. Patient is spanish speaking. Abdominal pain and arm pain reported. He works in [employer]. Was found to have a small bowel obstruction with R upper quadrant arterial occlusion. NG tube placed and thrombectomy to R radial and ulnar artery with star close right common femoral artery.   Clinical Impression   Pt seen for OT evaluation this date s/p revascularization and NG tube. Pt reports being INDEP at baseline including working and driving. Pt presents this date with some decreased strength and tolerance, but overall, is very close to his functional baseline. Pt performs transfers and fxl mobility with CGA to SUPV/SBA with no AD, demos improving tolerance, and demos functional strength and use of his R hand that he reports is close to normal for him and significantly improved post-op'ly versus when he presented to hospital. OT educates pt re: UB/LB stretching to maintain integrity of connective tissue and prevent shortening/contracture. Pt with good comprehension. No further skilled OT needs identified acutely, nor need for f/u. Anticipate pt could benefit from intermittent supervision while initially recovering at home.     Follow Up Recommendations  No OT follow up;Supervision - Intermittent    Equipment Recommendations  3 in 1 bedside commode    Recommendations for Other Services       Precautions / Restrictions Precautions Precautions: Fall Restrictions Weight Bearing Restrictions: No      Mobility Bed Mobility Overal bed mobility: Independent             General bed mobility comments: up to chair pre/post session    Transfers Overall transfer level: Needs assistance Equipment used:  None Transfers: Sit to/from Stand Sit to Stand: Supervision         General transfer comment: good control and steadiness, CGA initailly, with progress to SUPV.    Balance Overall balance assessment: Mild deficits observed, not formally tested Sitting-balance support: No upper extremity supported;Feet supported Sitting balance-Leahy Scale: Good Sitting balance - Comments: able to assist with reaching and LE movements without LOB   Standing balance support: No upper extremity supported;During functional activity Standing balance-Leahy Scale: Fair Standing balance comment: able to ambulate short durations without UE support, pushes IV pole for longer duration.                           ADL either performed or assessed with clinical judgement   ADL Overall ADL's : Modified independent;At baseline                                       General ADL Comments: increased time required     Vision Patient Visual Report: No change from baseline       Perception     Praxis      Pertinent Vitals/Pain Pain Assessment: Faces Faces Pain Scale: Hurts a little bit Pain Location: NG tube/suction uncomfortable, pt reports his throat and mouth are dry. Pain Descriptors / Indicators: Discomfort;Nagging Pain Intervention(s): Monitored during session;Other (comment) (RN notified)     Hand Dominance     Extremity/Trunk Assessment Upper Extremity Assessment Upper Extremity Assessment: Overall WFL for tasks assessed;RUE deficits/detail  RUE Deficits / Details: ROM back to [facility], pt reports improved sin sx   Lower Extremity Assessment Lower Extremity Assessment: Defer to PT evaluation;Overall [facility] for tasks assessed   Cervical / Trunk Assessment Cervical / Trunk Assessment: Normal   Communication Communication Communication: No difficulties   Cognition Arousal/Alertness: Awake/alert Behavior During Therapy: WFL for tasks assessed/performed Overall Cognitive  Status: Within Functional Limits for tasks assessed                                 General Comments: A and O x 4   General Comments  Patient appears well groomed and nourished.    Exercises Other Exercises Other Exercises: Pt educated on role of OT, importance of gradually stretching once R groin healed to prevent shortening of connective tissue. Pt with good understanding. Interpreter iPad used, [employer] [phone]   Shoulder Instructions      Home Living Family/patient expects to be discharged to:: Private residence Living Arrangements: Children (recently separated from spouse, but reports that she can also help him some if needed.) Available Help at Discharge: Family Type of Home: House Home Access: Stairs to enter [employer] of Steps: 6-7 Entrance Stairs-Rails: Right;Left Home Layout: One level     Bathroom Shower/Tub: Walk-in shower         Home Equipment: Shower seat   Additional Comments: Patient is independent without AD at baseline. Reports he is recently seperated from wife so has daughter intermittently with him but can have family present if needed.      Prior Functioning/Environment Level of Independence: Independent        Comments: Patient works as a Proofreader.        OT Problem List: Decreased activity tolerance      OT Treatment/Interventions: Self-care/ADL training;Therapeutic activities    OT Goals(Current goals can be found in the care plan section) Acute Rehab OT Goals Patient Stated Goal: to return home OT Goal Formulation: All assessment and education complete, DC therapy  OT Frequency:     Barriers to D/C:            Co-evaluation              AM-PAC OT "6 Clicks" Daily Activity     Outcome Measure Help from another person eating meals?: None Help from another person taking care of personal grooming?: None Help from another person toileting, which includes using toliet, bedpan, or  urinal?: None Help from another person bathing (including washing, rinsing, drying)?: A Little Help from another person to put on and taking off regular upper body clothing?: None Help from another person to put on and taking off regular lower body clothing?: None 6 Click Score: 23   End of Session Nurse Communication: Mobility status  Activity Tolerance: Patient tolerated treatment well Patient left: in chair;with call bell/phone within reach  OT Visit Diagnosis: Muscle weakness (generalized) (M62.81)                Time: 8676-7209 OT Time Calculation (min): 24 min Charges:  OT General Charges $OT Visit: 1 Visit OT Evaluation $OT Eval Low Complexity: 1 Low OT Treatments $Self Care/Home Management : 8-22 mins  Rejeana Brock, MS, OTR/L ascom [phone] 07/26/20, 5:16 PM

## 2020-07-26 NOTE — Progress Notes (Signed)
[facility] Daily Progress Note   Subjective: Without complaint this AM.  No issues overnight.  Reports improvement in discomfort, numbness and motility this AM.  Objective: Vitals:   07/25/20 2038 07/25/20 2243 07/26/20 0529 07/26/20 0739  BP: 133/88 (!) 159/92 (!) 154/83 (!) 163/89  Pulse: 74 75 72 76  Resp: 16 16 16 16   Temp: 97.7 F (36.5 C) 97.7 F (36.5 C) 98 F (36.7 C) 98.1 F (36.7 C)  TempSrc: Oral Oral Oral Oral  SpO2: 96% 95% 96% 97%  Weight:      Height:        Intake/Output Summary (Last 24 hours) at 07/26/2020 1004 Last data filed at 07/26/2020 07/28/2020 Gross per 24 hour  Intake 59.2 ml  Output 550 ml  Net -490.8 ml   Physical Exam: A&Ox3, NAD CV: RRR Pulmonary: CTA Bilaterally Abdomen: Soft, Non-tender, Non-distended  NG: Intact, Sumping Groin:  PAD intact Vascular:  Right upper extremity: Soft.  Extremities warm distally to fingers.  Palpable radial pulse.  Motor/sensory is intact.  Good capillary refill.   Laboratory: CBC    Component Value Date/Time   WBC 13.0 (H) 07/26/2020 0429   HGB 12.5 (L) 07/26/2020 0429   HCT 35.6 (L) 07/26/2020 0429   PLT 341 07/26/2020 0429   BMET    Component Value Date/Time   NA 139 07/26/2020 0429   K 3.7 07/26/2020 0429   CL 103 07/26/2020 0429   CO2 26 07/26/2020 0429   GLUCOSE 112 (H) 07/26/2020 0429   BUN 27 (H) 07/26/2020 0429   CREATININE 1.09 07/26/2020 0429   CALCIUM 8.6 (L) 07/26/2020 0429   GFRNONAA >60 07/26/2020 0429   GFRAA >60 08/31/2019 1840   Assessment/Planning: The patient is a 55 year old male who presented with an ischemic right upper extremity now status post endovascular intervention  1) successful revascularization of the patient's right upper extremity.  Improvement in discomfort and numbness this AM.  Patient is now able to move his hand freely.  Extremity is warm with a good palpable radial pulse.  2) patient is currently on heparin drip.  Once any need for  surgery is completely ruled out would transition to Eliquis.  3) small bowel obstruction.  NG is intact and sumping.  Patient states he is passing gas.  Denies any abdominal pain or nausea.  Followed by general surgery.   4) encouraged ambulation and out of bed today.  Discussed with Dr. 44 Almin Livingstone PA-C 07/26/2020 10:04 AM

## 2020-07-27 ENCOUNTER — Inpatient Hospital Stay: Payer: 59

## 2020-07-27 DIAGNOSIS — K56601 Complete intestinal obstruction, unspecified as to cause: Secondary | ICD-10-CM | POA: Diagnosis not present

## 2020-07-27 DIAGNOSIS — R93421 Abnormal radiologic findings on diagnostic imaging of right kidney: Secondary | ICD-10-CM | POA: Diagnosis not present

## 2020-07-27 DIAGNOSIS — I7090 Unspecified atherosclerosis: Secondary | ICD-10-CM | POA: Diagnosis not present

## 2020-07-27 DIAGNOSIS — I998 Other disorder of circulatory system: Secondary | ICD-10-CM | POA: Diagnosis not present

## 2020-07-27 LAB — CBC
HCT: 35.1 % — ABNORMAL LOW (ref 39.0–52.0)
Hemoglobin: 12.6 g/dL — ABNORMAL LOW (ref 13.0–17.0)
MCH: 30.9 pg (ref 26.0–34.0)
MCHC: 35.9 g/dL (ref 30.0–36.0)
MCV: 86 fL (ref 80.0–100.0)
Platelets: 293 10*3/uL (ref 150–400)
RBC: 4.08 MIL/uL — ABNORMAL LOW (ref 4.22–5.81)
RDW: 11.6 % (ref 11.5–15.5)
WBC: 10.7 10*3/uL — ABNORMAL HIGH (ref 4.0–10.5)
nRBC: 0 % (ref 0.0–0.2)

## 2020-07-27 LAB — HEPARIN LEVEL (UNFRACTIONATED)
Heparin Unfractionated: 0.37 IU/mL (ref 0.30–0.70)
Heparin Unfractionated: 0.43 IU/mL (ref 0.30–0.70)

## 2020-07-27 LAB — BASIC METABOLIC PANEL
Anion gap: 8 (ref 5–15)
BUN: 16 mg/dL (ref 6–20)
CO2: 25 mmol/L (ref 22–32)
Calcium: 8.4 mg/dL — ABNORMAL LOW (ref 8.9–10.3)
Chloride: 107 mmol/L (ref 98–111)
Creatinine, Ser: 0.78 mg/dL (ref 0.61–1.24)
GFR, Estimated: >60 mL/min (ref >60)
Glucose, Bld: 126 mg/dL — ABNORMAL HIGH (ref 70–99)
Potassium: 3.8 mmol/L (ref 3.5–5.1)
Sodium: 140 mmol/L (ref 135–145)

## 2020-07-27 LAB — MAGNESIUM: Magnesium: 2 mg/dL (ref 1.7–2.4)

## 2020-07-27 MED ORDER — APIXABAN 5 MG PO TABS
10.0000 mg | ORAL_TABLET | Freq: Two times a day (BID) | ORAL | Status: DC
  Administered 2020-07-27 – 2020-07-28 (×2): 10 mg via ORAL
  Filled 2020-07-27 (×2): qty 2

## 2020-07-27 MED ORDER — APIXABAN 5 MG PO TABS: 5.0000 mg | ORAL_TABLET | Freq: Two times a day (BID) | ORAL | Status: DC

## 2020-07-27 MED ORDER — AMLODIPINE BESYLATE 5 MG PO TABS
5.0000 mg | ORAL_TABLET | Freq: Every day | ORAL | Status: DC
  Administered 2020-07-27 – 2020-07-28 (×2): 5 mg via ORAL
  Filled 2020-07-27 (×2): qty 1

## 2020-07-27 NOTE — Consult Note (Signed)
ANTICOAGULATION CONSULT NOTE -  Pharmacy Consult for Heparin Indication: VTE Treatment No Known Allergies  Patient Measurements: Height: 5\' 4"  (162.6 cm) Weight: 98 kg (216 lb 0.8 oz) IBW/kg (Calculated) : 59.2 Heparin Dosing Weight: 81.2 kg  Vital Signs: Temp: 98.3 F (36.8 C) (01/20 0511) Temp Source: Oral (01/20 0511) BP: 160/89 (01/20 0511) Pulse Rate: 74 (01/20 0511)  Labs: Recent Labs    07/25/20 1021 07/25/20 1023 07/25/20 2137 07/26/20 0429 07/27/20 0339  HGB 15.7  --   --  12.5* 12.6*  HCT 44.5  --   --  35.6* 35.1*  PLT 363  --   --  341 293  APTT  --  25  --   --   --   HEPARINUNFRC  --   --  0.67 0.61 0.37  CREATININE 1.07  --   --  1.09 0.78   Estimated Creatinine Clearance: 114.1 mL/min (by C-G formula based on SCr of 0.78 mg/dL).  Medical History: History reviewed. No pertinent past medical history.  Medications:  No PTA APT or AC No AC allergies  Assessment: 55 y.o. male presents to the ER for evaluation of right upper extremity pain as well as paleness of the skin. CT of right upper extremity shows findings consistent with acute arterial occlusion of the distal brachial artery at the elbow, suspect thrombo embolus. Heparin Dosing Weight: 81.2 kg H&H and plts WNL  Goal of Therapy:  Heparin level 0.3-0.7 units/ml Monitor platelets by anticoagulation protocol: Yes   Plan:  Give 5500 units bolus x 1 Start heparin infusion at 1400 units/hr Check anti-Xa level in 6 hours and daily while on heparin Continue to monitor H&H and platelets    0118 2137 HL 0.67, therapeutic.  Will continue Heparin at current rate and recheck HL in am to confirm.  Monitor CBC daily per protocol.  0119 0429 HL 0.61, therapeutic x 2.  Will continue Heparin at current rate and recheck HL and CBC daily.  PLTs stable, H/H worse (likely dilutional)  0120 0339 HL 0.37, therapeutic.  Will continue Heparin at current rate and recheck HL and CBC daily.  CBC stable.   2138 A 07/27/2020,6:02 AM

## 2020-07-27 NOTE — Progress Notes (Signed)
PROGRESS NOTE    Jesse StaiJulio C Romero Chapman  ZOX:[id number] DOB: 08-01-1965 DOA: 07/25/2020 PCP: Patient, No Pcp Per    Chief Complaint  Patient presents with  . Clot in Hand    Brief Narrative:  Patient 55 year old gentleman no prior medical history, poor outpatient follow-up not on any antimedications presented to the ED with right arm pain and numbness, hand discoloration started the morning of admission at 5 AM 07/25/2020.  Patient also with associated abdominal pain of the lower abdomen with some emesis and nausea.  CT abdomen and pelvis done concerning for small bowel obstruction.  CT angiogram right upper extremity done concerning for acute arterial occlusion of the distal brachial artery at the elbow, suspect thromboembolism.  Patient seen in consultation by general surgery as well as vascular surgery.  Due to concerns for right upper extremity ischemia patient underwent thrombectomy per vascular surgery 07/25/2020.   Assessment & Plan:   Principal Problem:   Arterial occlusion Active Problems:   Ischemia of right upper extremity   SBO (small bowel obstruction) (HCC)   Obesity (BMI 30-39.9)   HTN (hypertension)   Leukocytosis   Abnormal finding on diagnostic imaging of right kidney   Bowel obstruction (HCC)   1 right upper extremity ischemia/acute thromboembolism right upper extremity Questionable etiology.  Concern likely cardiac etiology.  Patient seen by vascular surgery patient underwent mechanical thrombectomy of the right radial and right ulnar artery with percutaneous transluminal angioplasty of the right ulnar artery to 2 mm on 07/25/2020.  Patient with clinical improvement with improvement with right upper extremity pain, numbness and mobility.  Currently on heparin drip.  2D echo with normal EF, no wall motion abnormalities, no source of emboli noted.  Per vascular surgery once no need for surgery could likely transition to Eliquis.  Patient will likely need screening  colonoscopy done in the outpatient setting.  Once deemed okay to discontinue anticoagulation may need work-up for hypercoagulable panel in the outpatient setting.  Per vascular surgery.  2.  Small bowel obstruction Patient presented with abdominal pain, CT abdomen and pelvis consistent with a small bowel obstruction.  Patient passing flatus.  Patient stated had bowel movement.  Denies any abdominal pain.  Abdominal films obtained today near complete resolution of small bowel distention.  Oral contrast noted throughout colon and rectum.  General surgery following and looks like patient has been started on clears which she seems to be tolerating.  Currently on heparin drip in case patient may need surgery.  Per general surgery.  3.  Hypertension Blood pressure noted to be elevated.  Patient with no prior history of hypertension.  On IV Lopressor while he was n.p.o.  Patient currently on clears.  Start Norvasc 5 mg daily and uptitrate as needed for better blood pressure control.  Discontinue IV Lopressor.  Will need outpatient follow-up with PCP for further management. TOC consulted to help with PCP.  4.  Leukocytosis Likely secondary to problem #1 and #2.  Likely reactive.  Patient currently afebrile.  Leukocytosis trending down.  No respiratory symptoms.  No urinary symptoms.  Hold off on antibiotics.  Supportive care.  Follow.   5.  Right mid kidney with wedge-shaped area of decreased enhancement Per CT concerning for infarct versus focal pyelonephritis.  Likely secondary to thromboembolism.  Renal function stable.  Patient with no CVA tenderness.  No urinary symptoms.  2D echo done with EF 60 to 65%, no wall motion abnormalities, no source of emboli noted.  Currently on anticoagulation  with IV heparin which we will continue for now and when it is deemed no further surgery needed would likely transition to Eliquis.  Outpatient follow-up.  6.  Obesity BMI 37.09 kg/m2   DVT prophylaxis: Heparin Code  Status: Full Family Communication: Updated patient and daughter at bedside. Disposition:   Status is: Inpatient    Dispo: The patient is from: Home              Anticipated d/c is to: Home              Anticipated d/c date is: 1-2 days               Patient currently with being started on clears.  Status post thrombectomy.  Not stable for discharge.        Consultants:   General surgery: Dr. Hazle Quant 07/25/2020  Vascular surgery: Dr. Gilda Crease 07/25/2020  Procedures:   CT angiogram right upper extremity 07/25/2020  CT angiogram chest abdomen and pelvis 07/25/2020  Abdominal films 07/26/2020  2D echo pending 07/26/2020  Upper extremity angiogram 07/25/2020  Heart aortogram/right upper extremity angiography third order catheter placement with additional third order/mechanical thrombectomy right radial artery/mechanical thrombectomy right ulnar artery/percutaneous transluminal angioplasty right ulnar artery to 2 mm/[employer] right common femoral artery per Dr.Schnier, vascular surgery 07/25/2020  Antimicrobials:   None   Subjective: Sitting up in bed.  Denies any chest pain.  No abdominal pain.  Passing gas.  Stated had bowel movement.  No emesis.  No pain in right upper extremity.  NG tube has been removed.  Started on clears which he seems to be tolerating.  States he is hungry.  Stated ambulated around the unit with physical therapy.  Objective: Vitals:   07/26/20 2231 07/27/20 0511 07/27/20 0736 07/27/20 1130  BP: (!) 148/83 (!) 160/89 (!) 157/95 (!) 159/86  Pulse: 71 74 78 69  Resp: 16  16   Temp: 99.4 F (37.4 C) 98.3 F (36.8 C)  97.8 F (36.6 C)  TempSrc: Oral Oral  Oral  SpO2: 97% 97% 97% 100%  Weight:      Height:        Intake/Output Summary (Last 24 hours) at 07/27/2020 1210 Last data filed at 07/27/2020 0515 Gross per 24 hour  Intake 2015.81 ml  Output 500 ml  Net 1515.81 ml   Filed Weights   07/25/20 1205  Weight: 98 kg     Examination:  General exam: No acute distress. Respiratory system: Lungs clear to auscultation bilaterally.  No wheezes, no crackles, no rhonchi.  Normal respiratory effort.  Cardiovascular system: Regular rate rhythm no murmurs rubs or gallops.  No JVD.  No lower extremity edema.  Gastrointestinal system: Abdomen is soft, nontender, nondistended, positive bowel sounds.  No rebound.  No guarding. Central nervous system: Alert and oriented. No focal neurological deficits. Extremities: Symmetric 5 x 5 power. Skin: No rashes, lesions or ulcers Psychiatry: Judgement and insight appear normal. Mood & affect appropriate.     Data Reviewed: I have personally reviewed following labs and imaging studies  CBC: Recent Labs  Lab 07/25/20 1021 07/26/20 0429 07/27/20 0339  WBC 18.1* 13.0* 10.7*  NEUTROABS 16.4*  --   --   HGB 15.7 12.5* 12.6*  HCT 44.5 35.6* 35.1*  MCV 85.7 86.0 86.0  PLT 363 341 293    Basic Metabolic Panel: Recent Labs  Lab 07/25/20 1021 07/26/20 0429 07/27/20 0339  NA 139 139 140  K 4.0 3.7 3.8  CL 101  103 107  CO2 24 26 25   GLUCOSE 162* 112* 126*  BUN 23* 27* 16  CREATININE 1.07 1.09 0.78  CALCIUM 9.4 8.6* 8.4*  MG  --   --  2.0    GFR: Estimated Creatinine Clearance: 114.1 mL/min (by C-G formula based on SCr of 0.78 mg/dL).  Liver Function Tests: Recent Labs  Lab 07/25/20 1021  AST 29  ALT 41  ALKPHOS 58  BILITOT 1.0  PROT 7.7  ALBUMIN 4.5    CBG: No results for input(s): GLUCAP in the last 168 hours.   Recent Results (from the past 240 hour(s))  Resp Panel by RT-PCR (Flu A&B, Covid) Nasopharyngeal Swab     Status: None   Collection Time: 07/25/20 10:21 AM   Specimen: Nasopharyngeal Swab; Nasopharyngeal(NP) swabs in vial transport medium  Result Value Ref Range Status   SARS Coronavirus 2 by RT PCR NEGATIVE NEGATIVE Final    Comment: (NOTE) SARS-CoV-2 target nucleic acids are NOT DETECTED.  The SARS-CoV-2 RNA is generally  detectable in upper respiratory specimens during the acute phase of infection. The lowest concentration of SARS-CoV-2 viral copies this assay can detect is 138 copies/mL. A negative result does not preclude SARS-Cov-2 infection and should not be used as the sole basis for treatment or other patient management decisions. A negative result may occur with  improper specimen collection/handling, submission of specimen other than nasopharyngeal swab, presence of viral mutation(s) within the areas targeted by this assay, and inadequate number of viral copies(<138 copies/mL). A negative result must be combined with clinical observations, patient history, and epidemiological information. The expected result is Negative.  Fact Sheet for Patients:  07/27/20  Fact Sheet for Healthcare Providers:  BloggerCourse.com  This test is no t yet approved or cleared by the [employer] FDA and  has been authorized for detection and/or diagnosis of SARS-CoV-2 by FDA under an Emergency Use Authorization (EUA). This EUA will remain  in effect (meaning this test can be used) for the duration of the COVID-19 declaration under Section 564(b)(1) of the Act, 21 U.S.C.section 360bbb-3(b)(1), unless the authorization is terminated  or revoked sooner.       Influenza A by PCR NEGATIVE NEGATIVE Final   Influenza B by PCR NEGATIVE NEGATIVE Final    Comment: (NOTE) The Xpert Xpress SARS-CoV-2/FLU/RSV plus assay is intended as an aid in the diagnosis of influenza from Nasopharyngeal swab specimens and should not be used as a sole basis for treatment. Nasal washings and aspirates are unacceptable for Xpert Xpress SARS-CoV-2/FLU/RSV testing.  Fact Sheet for Patients: [place]  Fact Sheet for Healthcare Providers: BloggerCourse.com  This test is not yet approved or cleared by the [employer] FDA  and has been authorized for detection and/or diagnosis of SARS-CoV-2 by FDA under an Emergency Use Authorization (EUA). This EUA will remain in effect (meaning this test can be used) for the duration of the COVID-19 declaration under Section 564(b)(1) of the Act, 21 U.S.C. section 360bbb-3(b)(1), unless the authorization is terminated or revoked.  Performed at [facility], [address], [place], [place]          Radiology Studies: PERIPHERAL VASCULAR CATHETERIZATION  Result Date: 07/25/2020 See Op note  DG Abd 2 Views  Result Date: 07/26/2020 CLINICAL DATA:  Bowel obstruction EXAM: ABDOMEN - 2 VIEW COMPARISON:  CT angio 07/25/2020 FINDINGS: Nasogastric tube coiled in proximal stomach. Few mildly prominent air-filled loops of small bowel in the LEFT mid abdomen. These have slightly decreased in caliber  and number since previous exam. Excreted contrast within urinary bladder. No bowel wall thickening or free air. Minimal atelectasis at lung bases. Sclerotic focus at the LEFT femoral head unchanged, nonspecific Osseous structures otherwise unremarkable. IMPRESSION: Decreased small bowel distension since prior exam. Electronically Signed   By: Ulyses Southward M.D.   On: 07/26/2020 08:31   DG Abd Portable 1V-Small Bowel Obstruction Protocol-initial, 8 hr delay  Result Date: 07/27/2020 CLINICAL DATA:  Small-bowel obstruction. EXAM: PORTABLE ABDOMEN - 1 VIEW COMPARISON:  07/26/2020.  CT 07/25/2020. FINDINGS: NG tube noted with tip in the stomach. Interval near complete resolution of small-bowel distention. Oral contrast noted throughout the colon and rectum. Hemidiaphragms not imaged. No free air identified. Stable sclerotic density in the left femur, possibly a bone island. No other focal bony abnormalities identified. IMPRESSION: 1. NG tube noted with tip in the stomach. 2. Interval near complete resolution of small-bowel distention. Oral contrast noted throughout the colon  and rectum. Electronically Signed   By: Maisie Fus  Register   On: 07/27/2020 05:44   ECHOCARDIOGRAM COMPLETE  Result Date: 07/26/2020    ECHOCARDIOGRAM REPORT   Patient Name:   Jesse Chapman [place] Date of Exam: 07/26/2020 Medical Rec #:  [id number]             Height:       64.0 in Accession #:    [id number]            Weight:       216.0 lb Date of Birth:  1965/07/17             BSA:          2.022 m Patient Age:    54 years              BP:           151/90 mmHg Patient Gender: M                     HR:           79 bpm. Exam Location:  [facility] Procedure: 2D Echo, Cardiac Doppler and Color Doppler Indications:     Thromboembolism  History:         Patient has no prior history of Echocardiogram examinations. No                  medical history on file.  Sonographer:     Cristela Blue RDCS (AE) Referring Phys:  [id number] Rodolph Bong Diagnosing Phys: Debbe Odea MD IMPRESSIONS  1. Left ventricular ejection fraction, by estimation, is 60 to 65%. The left ventricle has normal function. The left ventricle has no regional wall motion abnormalities. Left ventricular diastolic parameters were normal.  2. Right ventricular systolic function is normal. The right ventricular size is normal.  3. The mitral valve is normal in structure. No evidence of mitral valve regurgitation. No evidence of mitral stenosis.  4. The aortic valve is grossly normal. Aortic valve regurgitation is not visualized. No aortic stenosis is present. FINDINGS  Left Ventricle: Left ventricular ejection fraction, by estimation, is 60 to 65%. The left ventricle has normal function. The left ventricle has no regional wall motion abnormalities. The left ventricular internal cavity size was normal in size. There is  no left ventricular hypertrophy. Left ventricular diastolic parameters were normal. Right Ventricle: The right ventricular size is normal. No increase in right ventricular wall thickness. Right ventricular systolic function is normal. Left  Atrium: Left atrial  size was normal in size. Right Atrium: Right atrial size was normal in size. Pericardium: There is no evidence of pericardial effusion. Mitral Valve: The mitral valve is normal in structure. No evidence of mitral valve regurgitation. No evidence of mitral valve stenosis. Tricuspid Valve: The tricuspid valve is normal in structure. Tricuspid valve regurgitation is not demonstrated. No evidence of tricuspid stenosis. Aortic Valve: The aortic valve is grossly normal. Aortic valve regurgitation is not visualized. No aortic stenosis is present. Aortic valve mean gradient measures 4.0 mmHg. Aortic valve peak gradient measures 7.4 mmHg. Aortic valve area, by VTI measures 2.28 cm. Pulmonic Valve: The pulmonic valve was normal in structure. Pulmonic valve regurgitation is not visualized. No evidence of pulmonic stenosis. Aorta: The aortic root is normal in size and structure. Venous: The inferior vena cava was not well visualized. IAS/Shunts: No atrial level shunt detected by color flow Doppler.  LEFT VENTRICLE PLAX 2D LVIDd:         4.22 cm  Diastology LVIDs:         3.00 cm  LV e' medial:    6.53 cm/s LV PW:         1.54 cm  LV E/e' medial:  12.6 LV IVS:        0.88 cm  LV e' lateral:   7.72 cm/s LVOT diam:     2.10 cm  LV E/e' lateral: 10.7 LV SV:         50 LV SV Index:   24 LVOT Area:     3.46 cm  RIGHT VENTRICLE RV Basal diam:  3.28 cm RV S prime:     11.20 cm/s TAPSE (M-mode): 3.7 cm LEFT ATRIUM             Index       RIGHT ATRIUM           Index LA diam:        3.40 cm 1.68 cm/m  RA Area:     19.30 cm LA Vol (A2C):   50.2 ml 24.83 ml/m RA Volume:   52.80 ml  26.12 ml/m LA Vol (A4C):   41.2 ml 20.38 ml/m LA Biplane Vol: 44.1 ml 21.81 ml/m  AORTIC VALVE                   PULMONIC VALVE AV Area (Vmax):    2.14 cm    PV Vmax:        1.01 m/s AV Area (Vmean):   2.11 cm    PV Peak grad:   4.1 mmHg AV Area (VTI):     2.28 cm    RVOT Peak grad: 4 mmHg AV Vmax:           136.00 cm/s AV Vmean:           95.700 cm/s AV VTI:            0.217 m AV Peak Grad:      7.4 mmHg AV Mean Grad:      4.0 mmHg LVOT Vmax:         84.20 cm/s LVOT Vmean:        58.300 cm/s LVOT VTI:          0.143 m LVOT/AV VTI ratio: 0.66  AORTA Ao Root diam: 3.37 cm MITRAL VALVE               TRICUSPID VALVE MV Area (PHT): 4.31 cm    TR Peak grad:   11.7 mmHg  MV Decel Time: 176 msec    TR Vmax:        171.00 cm/s MV E velocity: 82.30 cm/s MV A velocity: 66.40 cm/s  SHUNTS MV E/A ratio:  1.24        Systemic VTI:  0.14 m                            Systemic Diam: 2.10 cm Debbe Odea MD Electronically signed by Debbe Odea MD Signature Date/Time: 07/26/2020/4:26:22 PM    Final         Scheduled Meds: . amLODipine  5 mg Oral Daily  . atorvastatin  10 mg Oral q1800  . metoprolol tartrate  2.5 mg Intravenous Q8H  . sodium chloride flush  3 mL Intravenous Q12H   Continuous Infusions: . sodium chloride    . dextrose 5 % and 0.9 % NaCl with KCl 20 mEq/L 125 mL/hr at 07/27/20 0514  . heparin 1,400 Units/hr (07/27/20 0249)     LOS: 2 days    Time spent: 40 minutes    Ramiro Harvest, MD [facility]   To contact the attending provider between 7A-7P or the covering provider during after hours 7P-7A, please log into the web site [email or web address] and access using universal [place] password for that web site. If you do not have the password, please call the hospital operator.  07/27/2020, 12:10 PM

## 2020-07-27 NOTE — Progress Notes (Signed)
[facility] Daily Progress Note   Subjective: Without complaint this AM.  No issues overnight.  Reports continued improvement in discomfort, numbness and motility this AM.  Objective: Vitals:   07/26/20 2026 07/26/20 2231 07/27/20 0511 07/27/20 0736  BP: (!) 172/84 (!) 148/83 (!) 160/89 (!) 157/95  Pulse: 84 71 74 78  Resp: 20 16  16   Temp: 97.9 F (36.6 C) 99.4 F (37.4 C) 98.3 F (36.8 C)   TempSrc: Oral Oral Oral   SpO2: 98% 97% 97% 97%  Weight:      Height:        Intake/Output Summary (Last 24 hours) at 07/27/2020 1103 Last data filed at 07/27/2020 0515 Gross per 24 hour  Intake 2015.81 ml  Output 900 ml  Net 1115.81 ml   Physical Exam: A&Ox3, NAD CV: RRR Pulmonary: CTA Bilaterally Abdomen: Soft, Non-tender, Non-distended             NG: Intact, Sumping Groin:             PAD intact Vascular:             Right upper extremity: Soft.  Extremities warm distally to fingers.  Palpable radial pulse.  Motor/sensory is intact.  Good capillary refill.   Laboratory: CBC    Component Value Date/Time   WBC 10.7 (H) 07/27/2020 0339   HGB 12.6 (L) 07/27/2020 0339   HCT 35.1 (L) 07/27/2020 0339   PLT 293 07/27/2020 0339   BMET    Component Value Date/Time   NA 140 07/27/2020 0339   K 3.8 07/27/2020 0339   CL 107 07/27/2020 0339   CO2 25 07/27/2020 0339   GLUCOSE 126 (H) 07/27/2020 0339   BUN 16 07/27/2020 0339   CREATININE 0.78 07/27/2020 0339   CALCIUM 8.4 (L) 07/27/2020 0339   GFRNONAA >60 07/27/2020 0339   GFRAA >60 08/31/2019 1840   Assessment/Planning: The patient is a 55 year old male who presented with an ischemic right upper extremity now status post endovascular intervention - POD#2  1) successful revascularization of the patient's right upper extremity.  Improvement in discomfort and numbness this AM. Patient is now able to move his hand freely. Extremity is warm with a good palpable radial pulse.  2) patient is currently on  heparin drip.  Once any need for surgery is completely ruled out would transition to Eliquis. Possibly transition today?  3) encouraged continued ambulation and out of bed.  Discussed with Dr. 44 [place] Retana PA-C 07/27/2020 11:03 AM

## 2020-07-27 NOTE — Consult Note (Signed)
ANTICOAGULATION CONSULT NOTE -  Pharmacy Consult for Heparin Indication: VTE Treatment No Known Allergies  Patient Measurements: Height: 5\' 4"  (162.6 cm) Weight: 98 kg (216 lb 0.8 oz) IBW/kg (Calculated) : 59.2 Heparin Dosing Weight: 81.2 kg  Vital Signs: Temp: 98.6 F (37 C) (01/20 1458) Temp Source: Oral (01/20 1458) BP: 144/79 (01/20 1458) Pulse Rate: 76 (01/20 1458)  Labs: Recent Labs    07/25/20 1021 07/25/20 1023 07/25/20 2137 07/26/20 0429 07/27/20 0339  HGB 15.7  --   --  12.5* 12.6*  HCT 44.5  --   --  35.6* 35.1*  PLT 363  --   --  341 293  APTT  --  25  --   --   --   HEPARINUNFRC  --   --  0.67 0.61 0.37  CREATININE 1.07  --   --  1.09 0.78   Estimated Creatinine Clearance: 114.1 mL/min (by C-G formula based on SCr of 0.78 mg/dL).  Medical History: History reviewed. No pertinent past medical history.  Medications:  No PTA APT or AC No AC allergies  Assessment: 55 y.o. male presents to the ER for evaluation of right upper extremity pain as well as paleness of the skin. CT of right upper extremity shows findings consistent with acute arterial occlusion of the distal brachial artery at the elbow, suspect thrombo embolus. H&H slightly lower than normal limits but stable and platelets WNL  Goal of Therapy:  Heparin level 0.3-0.7 units/ml Monitor platelets by anticoagulation protocol: Yes   Plan:   Repeat heparin level is therapeutic  Continue heparin infusion at 1400 units/hr  Check anti-Xa level daily while on heparin  Continue to monitor H&H and platelets   44 07/27/2020,3:25 PM

## 2020-07-27 NOTE — Progress Notes (Signed)
SURGICAL PROGRESS NOTE   Hospital Day(s): 2.   Post op day(s): 2 Days Post-Op.   Interval History: Patient seen and examined, no acute events or new complaints overnight. Patient reports feeling great. He reports having multiple bowel movements. Denies nausea or vomiting. .  Vital signs in last 24 hours: [min-max] current  Temp:  [97.8 F (36.6 C)-99.4 F (37.4 C)] 98.3 F (36.8 C) (01/20 1924) Pulse Rate:  [69-87] 87 (01/20 1924) Resp:  [16-20] 20 (01/20 1924) BP: (144-160)/(79-95) 144/84 (01/20 1924) SpO2:  [97 %-100 %] 98 % (01/20 1924)     Height: 5\' 4"  (162.6 cm) Weight: 98 kg BMI (Calculated): 37.07   Physical Exam:  Constitutional: alert, cooperative and no distress  Respiratory: breathing non-labored at rest  Cardiovascular: regular rate and sinus rhythm  Gastrointestinal: soft, non-tender, and non-distended  Labs:  CBC Latest Ref Rng & Units 07/27/2020 07/26/2020 07/25/2020  WBC 4.0 - 10.5 K/uL 10.7(H) 13.0(H) 18.1(H)  Hemoglobin 13.0 - 17.0 g/dL 12.6(L) 12.5(L) 15.7  Hematocrit 39.0 - 52.0 % 35.1(L) 35.6(L) 44.5  Platelets 150 - 400 K/uL 293 341 363   CMP Latest Ref Rng & Units 07/27/2020 07/26/2020 07/25/2020  Glucose 70 - 99 mg/dL 07/27/2020) 865(H) 846(N)  BUN 6 - 20 mg/dL 16 629(B) 28(U)  Creatinine 0.61 - 1.24 mg/dL 13(K 4.40 1.02  Sodium 135 - 145 mmol/L 140 139 139  Potassium 3.5 - 5.1 mmol/L 3.8 3.7 4.0  Chloride 98 - 111 mmol/L 107 103 101  CO2 22 - 32 mmol/L 25 26 24   Calcium 8.9 - 10.3 mg/dL 7.25) ) 9.4  Total Protein 6.5 - 8.1 g/dL - - 7.7  Total Bilirubin 0.3 - 1.2 mg/dL - - 1.0  Alkaline Phos 38 - 126 U/L - - 58  AST 15 - 41 U/L - - 29  ALT 0 - 44 U/L - - 41    Imaging studies: I personally evaluated the images of the abdomen and there is contras in the colon without small bowel dilation   Assessment/Plan:  55 y.o. male with small bowel obstruction (resolved) and right upper extremity ischemia 2 Days Post-Op s/p thrombectomy and  angioplasty.  Patient with small bowel obstruction resolved. Gastrografin reached the large intestine in just 8 hours without sign of small bowel dilation. Patient passing gas and having multiple bowel movements. Tolerated clear liquids in the morning and advanced to soft diet. If patient tolerates soft diet, there will be no further general surgery management. No contraindication to start anticoagulation. Will follow.   4.4(I, MD

## 2020-07-27 NOTE — Progress Notes (Signed)
Physical Therapy Treatment Patient Details Name: Jesse Chapman MRN: [id number] DOB: 05-Jun-1966 Today's Date: 07/27/2020    History of Present Illness Patient is a 55 year old male who presents to ED for R arm pain, numbness, and hand discoloration beginning ~ 5am on 07/25/20. Patient is spanish speaking. Abdominal pain and arm pain reported. He works in [employer]. Was found to have a small bowel obstruction with R upper quadrant arterial occlusion. NG tube placed and thrombectomy to R radial and ulnar artery with star close right common femoral artery.    PT Comments    Pt tolerated treatment well today and was able to improve overall activity tolerance, assistance levels, and balance since last session. Pt was grossly supervision for all mobility for safety/line management, but is able to be Ind. Pt ambulated >480ft and performed stair training, demonstrating good balance and safety awareness. Pt has currently made good progress towards all goals that is adequate for DC, therefore, PT to sign off on all acute care PT needs at this time; please re-consult with any change in status. Pt appropriate to ambulate with nursing staff/mobility tech while hospitalized for maintenance of Ind; pt and RN verbalized understanding. Will recommend DC home with no further PT/DME needs; pt agreeable.   Follow Up Recommendations  No PT follow up     Equipment Recommendations  None recommended by PT    Recommendations for Other Services       Precautions / Restrictions Precautions Precautions: None Restrictions Weight Bearing Restrictions: No    Mobility  Bed Mobility Overal bed mobility: Independent             General bed mobility comments: Ind to sit EOB with use of BUE  Transfers Overall transfer level: Independent Equipment used: None Transfers: Sit to/from Stand Sit to Stand: Supervision;Independent         General transfer comment: Supervision for safety but able to be  Ind with STS from EOB and recliner without AD  Ambulation/Gait Ambulation/Gait assistance: Supervision Gait Distance (Feet): 415 Feet (361ft x1, 36ft x1) Assistive device: None       General Gait Details: Supervision for safety/line management to ambulate without AD. Pt demonstrates reciprocal gait pattern with good balance and adequate heel strike/toe off.   Stairs Stairs: Yes Stairs assistance: Min guard Stair Management: One rail Right Number of Stairs: 2 General stair comments: Reciprocal gait pattern with min guard for safety and unilateral UE support on railing. Further mobility limited due to restrictions from lines/leads. No LOB noted.     Balance Overall balance assessment: Independent   Sitting balance-Leahy Scale: Normal Sitting balance - Comments: Able to reach outside BOS for washing hands/opening doors without LOB   Standing balance support: No upper extremity supported;During functional activity Standing balance-Leahy Scale: Good Standing balance comment: Good balance during ambulation/stair training without LOB                            Cognition Arousal/Alertness: Awake/alert Behavior During Therapy: WFL for tasks assessed/performed Overall Cognitive Status: Within Functional Limits for tasks assessed                                 General Comments: Pt able to follow 100% of multistep commands.      Exercises Other Exercises Other Exercises: Pt able to participate in bed mobility, transfers, ambulation, and stair training with  gross supervision for safety/line management; pt able to be Ind with all mobility. Pt with good safety awareness and balance. RPE of 2-3/10 indicates "light activity". Other Exercises: Pt educated regarding: PT role/POC, updated DC recs, ambulation with nursing staff while hospitalized; he verbalized understanding.    General Comments        Pertinent Vitals/Pain Pain Assessment: No/denies pain            PT Goals (current goals can now be found in the care plan section) Acute Rehab PT Goals Patient Stated Goal: to return home PT Goal Formulation: With patient Time For Goal Achievement: 08/09/20 Potential to Achieve Goals: Good Progress towards PT goals: Goals met/education completed, patient discharged from PT    Frequency     (No further acute PT needs)      PT Plan Discharge plan needs to be updated;Frequency needs to be updated       AM-PAC PT "6 Clicks" Mobility   Outcome Measure  Help needed turning from your back to your side while in a flat bed without using bedrails?: None Help needed moving from lying on your back to sitting on the side of a flat bed without using bedrails?: None Help needed moving to and from a bed to a chair (including a wheelchair)?: A Little Help needed standing up from a chair using your arms (e.g., wheelchair or bedside chair)?: A Little Help needed to walk in hospital room?: A Little Help needed climbing 3-5 steps with a railing? : A Little 6 Click Score: 20    End of Session Equipment Utilized During Treatment: Gait belt Activity Tolerance: Patient tolerated treatment well Patient left: in chair;with call bell/phone within reach;with chair alarm set Nurse Communication: Mobility status (PT to sign off, ambulation with nursing while hospitalized) PT Visit Diagnosis: Unsteadiness on feet (R26.81)     Time: 5929-2446 PT Time Calculation (min) (ACUTE ONLY): 14 min  Charges:  $Therapeutic Activity: 8-22 mins                    Herminio Commons, PT, DPT 11:59 AM,07/27/20

## 2020-07-27 NOTE — TOC Initial Note (Signed)
Transition of Care [facility]) - Initial/Assessment Note    Patient Details  Name: Jesse Chapman MRN: [id number] Date of Birth: [birth year]  Transition of Care [facility] CM/SW Contact:    Candie Chroman, LCSW Phone Number: 07/27/2020, 4:44 PM  Clinical Narrative:   CSW met with patient. No supports at bedside. CSW introduced role and inquired about not having a PCP. Patient confirmed and is agreeable to [place] setting him up with someone in [place]. No preferences other than afternoon appointments. Patient stated his job recently switched owners so they switched their insurance to [facility]. Will call to get new PCP appointment tomorrow. Placed $10 copay card for Eliquis in discharge packet for patient to take home with him. No further concerns. CSW encouraged patient to contact CSW as needed. CSW will continue to follow patient for support and facilitate return home when stable.               Expected Discharge Plan: Home/Self Care Barriers to Discharge: Continued Medical Work up   Patient Goals and CMS Choice        Expected Discharge Plan and Services Expected Discharge Plan: Home/Self Care       Living arrangements for the past 2 months: Single Family Home                                      Prior Living Arrangements/Services Living arrangements for the past 2 months: Single Family Home Lives with:: Adult Children Patient language and need for interpreter reviewed:: Yes (Per RN, no interpreter needed. Patient understands majority of English.) Do you feel safe going back to the place where you live?: Yes      Need for Family Participation in Patient Care: Yes (Comment) Care giver support system in place?: Yes (comment)   Criminal Activity/Legal Involvement Pertinent to Current Situation/Hospitalization: No - Comment as needed  Activities of Daily Living Home Assistive Devices/Equipment: None ADL Screening (condition at time of admission) Patient's cognitive  ability adequate to safely complete daily activities?: Yes Is the patient deaf or have difficulty hearing?: No Does the patient have difficulty seeing, even when wearing glasses/contacts?: No Does the patient have difficulty concentrating, remembering, or making decisions?: No Patient able to express need for assistance with ADLs?: Yes Does the patient have difficulty dressing or bathing?: No Independently performs ADLs?: Yes (appropriate for developmental age) Does the patient have difficulty walking or climbing stairs?: No Weakness of Legs: None Weakness of Arms/Hands: None  Permission Sought/Granted Permission sought to share information with : Family Supports Permission granted to share information with : Yes, Verbal Permission Granted  Share Information with NAME: Vikki Ports     Permission granted to share info w Relationship: Daughter  Permission granted to share info w Contact Information: [phone]  Emotional Assessment Appearance:: Appears stated age Attitude/Demeanor/Rapport: Engaged,Gracious Affect (typically observed): Accepting,Appropriate,Calm,Pleasant Orientation: : Oriented to Self,Oriented to Place,Oriented to  Time,Oriented to Situation Alcohol / Substance Use: Not Applicable Psych Involvement: No (comment)  Admission diagnosis:  SBO (small bowel obstruction) ([place]) [K56.609] Limb ischemia [I99.8] Patient Active Problem List   Diagnosis Date Noted  . Ischemia of right upper extremity 07/26/2020  . HTN (hypertension) 07/26/2020  . Leukocytosis 07/26/2020  . Abnormal finding on diagnostic imaging of right kidney 07/26/2020  . Bowel obstruction ([employer])   . SBO (small bowel obstruction) ([place]) 07/25/2020  . 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 Obesity (BMI  30-39.9) 07/25/2020   PCP:  Patient, No Pcp Per Pharmacy:   [id number] [place], [place] [address] Phone: [phone] Fax: [phone]     Social  Determinants of Health (SDOH) Interventions    Readmission Risk Interventions No flowsheet data found.

## 2020-07-27 NOTE — Clinical Social Work Note (Signed)
CSW acknowledges consult for PCP and medication needs. CSW found a [employer] card that was scanned into the chart 07/25/20. Financial counselors trying to confirm if he has [employer] or [employer]. Will discuss new PCP appt with patient once confirmed. Submitted benefits check for Eliquis.  Charlynn Court, CSW [phone]

## 2020-07-27 NOTE — Consult Note (Signed)
ANTICOAGULATION CONSULT NOTE  Pharmacy Consult for apixaban Indication: VTE Treatment  Patient Measurements: Height: 5\' 4"  (162.6 cm) Weight: 98 kg (216 lb 0.8 oz) IBW/kg (Calculated) : 59.2 Heparin Dosing Weight: 81.2 kg  Vital Signs: Temp: 98.6 F (37 C) (01/20 1458) Temp Source: Oral (01/20 1458) BP: 144/79 (01/20 1458) Pulse Rate: 76 (01/20 1458)  Labs: Recent Labs    07/25/20 1021 07/25/20 1023 07/25/20 2137 07/26/20 0429 07/27/20 0339 07/27/20 1547  HGB 15.7  --   --  12.5* 12.6*  --   HCT 44.5  --   --  35.6* 35.1*  --   PLT 363  --   --  341 293  --   APTT  --  25  --   --   --   --   HEPARINUNFRC  --   --    < > 0.61 0.37 0.43  CREATININE 1.07  --   --  1.09 0.78  --    < > = values in this interval not displayed.   Estimated Creatinine Clearance: 114.1 mL/min (by C-G formula based on SCr of 0.78 mg/dL).  Medical History: History reviewed. No pertinent past medical history.  Assessment: 55 y.o. male presents to the ER for evaluation of right upper extremity pain as well as paleness of the skin. CT of right upper extremity showed findings consistent with acute arterial occlusion of the distal brachial artery at the elbow. Now POD 2 s/p  revascularization of the patient's right upper extremity. H&H slightly lower than normal limits but stable and platelets WNL  Goal of Therapy:  Monitor platelets by anticoagulation protocol: Yes   Plan:   Stop heparin infusion  One hour after heparin is stopped, start apixaban 10 mg twice daily for 7 days followed by 5 mg twice daily  Continue to monitor H&H and platelets   44 07/27/2020,5:00 PM

## 2020-07-28 DIAGNOSIS — I998 Other disorder of circulatory system: Secondary | ICD-10-CM | POA: Diagnosis not present

## 2020-07-28 DIAGNOSIS — R93421 Abnormal radiologic findings on diagnostic imaging of right kidney: Secondary | ICD-10-CM | POA: Diagnosis not present

## 2020-07-28 DIAGNOSIS — I7090 Unspecified atherosclerosis: Secondary | ICD-10-CM | POA: Diagnosis not present

## 2020-07-28 DIAGNOSIS — I10 Essential (primary) hypertension: Secondary | ICD-10-CM | POA: Diagnosis not present

## 2020-07-28 LAB — MAGNESIUM: Magnesium: 2 mg/dL (ref 1.7–2.4)

## 2020-07-28 LAB — CBC
HCT: 33 % — ABNORMAL LOW (ref 39.0–52.0)
Hemoglobin: 11.9 g/dL — ABNORMAL LOW (ref 13.0–17.0)
MCH: 30.9 pg (ref 26.0–34.0)
MCHC: 36.1 g/dL — ABNORMAL HIGH (ref 30.0–36.0)
MCV: 85.7 fL (ref 80.0–100.0)
Platelets: 277 10*3/uL (ref 150–400)
RBC: 3.85 MIL/uL — ABNORMAL LOW (ref 4.22–5.81)
RDW: 11.6 % (ref 11.5–15.5)
WBC: 7.8 10*3/uL (ref 4.0–10.5)
nRBC: 0 % (ref 0.0–0.2)

## 2020-07-28 LAB — BASIC METABOLIC PANEL
Anion gap: 9 (ref 5–15)
BUN: 13 mg/dL (ref 6–20)
CO2: 23 mmol/L (ref 22–32)
Calcium: 8.3 mg/dL — ABNORMAL LOW (ref 8.9–10.3)
Chloride: 106 mmol/L (ref 98–111)
Creatinine, Ser: 0.77 mg/dL (ref 0.61–1.24)
GFR, Estimated: >60 mL/min (ref >60)
Glucose, Bld: 127 mg/dL — ABNORMAL HIGH (ref 70–99)
Potassium: 3.6 mmol/L (ref 3.5–5.1)
Sodium: 138 mmol/L (ref 135–145)

## 2020-07-28 LAB — HEPARIN LEVEL (UNFRACTIONATED): Heparin Unfractionated: 2.56 IU/mL — ABNORMAL HIGH (ref 0.30–0.70)

## 2020-07-28 MED ORDER — AMLODIPINE BESYLATE 5 MG PO TABS: 5.0000 mg | ORAL_TABLET | Freq: Every day | ORAL | 1 refills | Status: DC

## 2020-07-28 MED ORDER — ATORVASTATIN CALCIUM 10 MG PO TABS: 10.0000 mg | ORAL_TABLET | Freq: Every day | ORAL | 1 refills | Status: DC

## 2020-07-28 MED ORDER — POTASSIUM CHLORIDE CRYS ER 20 MEQ PO TBCR
40.0000 meq | EXTENDED_RELEASE_TABLET | Freq: Once | ORAL | Status: AC
  Administered 2020-07-28: 40 meq via ORAL
  Filled 2020-07-28: qty 2

## 2020-07-28 MED ORDER — APIXABAN 5 MG PO TABS: 10.0000 mg | ORAL_TABLET | Freq: Two times a day (BID) | ORAL | 0 refills | Status: DC

## 2020-07-28 MED ORDER — APIXABAN 5 MG PO TABS: 5.0000 mg | ORAL_TABLET | Freq: Two times a day (BID) | ORAL | 2 refills | Status: DC

## 2020-07-28 NOTE — TOC Benefit Eligibility Note (Signed)
Transition of Care [facility]) Benefit Eligibility Note    Patient Details  Name: Jesse Chapman MRN: [id number] Date of Birth: [birth year]   Medication/Dose: Eliquis 5 mg PO BID  Covered?: Yes     Prescription Coverage [facility]  Spoke with Person/Company/Phone Number:: [facility] [employer] for medication, [phone]  Co-Pay: Generic med Upt to $30, Preferred $150, Non-preferred $250, Specialty 40% of cost             Verita Schneiders Raegan Winders Phone Number: 07/28/2020, 7:27 AM

## 2020-07-28 NOTE — Progress Notes (Signed)
SURGICAL PROGRESS NOTE   Hospital Day(s): 3.   Post op day(s): 3 Days Post-Op.   Interval History: Patient seen and examined, no acute events or new complaints overnight. Patient reports feeling great.  He denies abdominal pain.  He denies nausea or vomiting.  He report he continue passing gas and having regular bowel movements.  Patient tolerated soft diet yesterday.  There has been no worsening abdominal pain.  There is no upper extremity pain.  Patient is ambulating.  Vital signs in last 24 hours: [min-max] current  Temp:  [97.8 F (36.6 C)-98.6 F (37 C)] 98 F (36.7 C) (01/21 0732) Pulse Rate:  [67-87] 67 (01/21 0732) Resp:  [20] 20 (01/21 0732) BP: (121-159)/(76-86) 121/76 (01/21 0732) SpO2:  [96 %-100 %] 97 % (01/21 0732)     Height: 5\' 4"  (162.6 cm) Weight: 98 kg BMI (Calculated): 37.07   Physical Exam:  Constitutional: alert, cooperative and no distress  Respiratory: breathing non-labored at rest  Cardiovascular: regular rate and sinus rhythm  Gastrointestinal: soft, non-tender, and non-distended  Labs:  CBC Latest Ref Rng & Units 07/28/2020 07/27/2020 07/26/2020  WBC 4.0 - 10.5 K/uL 7.8 10.7(H) 13.0(H)  Hemoglobin 13.0 - 17.0 g/dL 11.9(L) 12.6(L) 12.5(L)  Hematocrit 39.0 - 52.0 % 33.0(L) 35.1(L) 35.6(L)  Platelets 150 - 400 K/uL 277 293 341   CMP Latest Ref Rng & Units 07/28/2020 07/27/2020 07/26/2020  Glucose 70 - 99 mg/dL 07/28/2020) 353(G) 992(E)  BUN 6 - 20 mg/dL 13 16 268(T)  Creatinine 0.61 - 1.24 mg/dL 41(D 6.22 2.97  Sodium 135 - 145 mmol/L 138 140 139  Potassium 3.5 - 5.1 mmol/L 3.6 3.8 3.7  Chloride 98 - 111 mmol/L 106 107 103  CO2 22 - 32 mmol/L 23 25 26   Calcium 8.9 - 10.3 mg/dL 8.3(L) 8.4(L) 8.6(L)  Total Protein 6.5 - 8.1 g/dL - - -  Total Bilirubin 0.3 - 1.2 mg/dL - - -  Alkaline Phos 38 - 126 U/L - - -  AST 15 - 41 U/L - - -  ALT 0 - 44 U/L - - -    Imaging studies: No new pertinent imaging studies   Assessment/Plan:  55 y.o. male with small bowel  obstruction (resolved) and right upper extremity ischemia 2 Days Post-Op s/p thrombectomy and angioplasty.  Patient tolerated soft diet.  The transient small bowel obstruction seems to be resolved clinically.  Patient having regular bowel movement and passing gas.  From the general surgery standpoint there is no further work-up, imaging or management.  Patient does not need outpatient follow-up with general surgery.  I will be available if there is any other concern.  , MD

## 2020-07-28 NOTE — Discharge Summary (Signed)
Physician Discharge Summary  Jesse Chapman VPX:[id number] DOB: 1965-09-17 DOA: 07/25/2020  PCP: Patient, No Pcp Per  Admit date: 07/25/2020 Discharge date: 07/28/2020  Time spent: 60 minutes  Recommendations for Outpatient Follow-up:  1. Follow-up with Dr. Gilda Crease, vascular surgery in 2 weeks. 2. Follow-up with Dr. Welton Flakes, as new PCP for hospital follow-up on 08/08/2020.  On follow-up patient will need a basic metabolic profile done to follow-up on electrolytes and renal function.  Patient will need a CBC done to follow-up on H&H.  Patient will need to be set up for screening colonoscopy.  Patient may require further outpatient work-up for etiology of thromboembolism may need referral to hematology.  Patient's blood pressure also needs to be reassessed on follow-up.   Discharge Diagnoses:  Principal Problem:   Arterial occlusion Active Problems:   Ischemia of right upper extremity   SBO (small bowel obstruction) (HCC)   Obesity (BMI 30-39.9)   HTN (hypertension)   Leukocytosis   Abnormal finding on diagnostic imaging of right kidney   Bowel obstruction (HCC)   Limb ischemia   Discharge Condition: Stable and improved  Diet recommendation: Heart healthy  Filed Weights   07/25/20 1205  Weight: 98 kg    History of present illness:  HPI per Dr. Rikki Spearing Jesse Chapman is a 55 y.o. male with medical history significant for no previous medical diagnosis and poor outpatient follow-up, and currently not taking any prescribed medications presented to the emergency department for chief concerns of right arm pain, numbness, and hand discoloration that started approximately 5 AM on 07/25/2020.  He states the arm pain is throbbing pain.  He endorsed that the pain is persistent, 10 out of 10, and associated with skin discoloration.  He states this is never happened before.  He endorsed associated abdominal pain started at 3 and 5 AM this morning, while he was sleeping.  Bilateral  lower abdominal pain. He endorses associated nausea and vomiting.  He reports the vomitus is food and bile.  He endorses taking 400 mg ibuprofen this AM prior to presenting to the ED and it did not improve his symptioms.  He denies fever.  He denies diarrhea. He denies burning or pain with urination. He endorses a little weakness.  He states he last ate last night, 07/24/2020.  He has not been able to tolerate p.o. intake due to nausea and vomiting.  He reports not passing of gas since last night.  He reports that his last bowel movement was evening of 07/24/2020.  He reports the stool was a small amount and normal in color.  He denies black or red stools.  He reports that he has never had a colonoscopy.  He states that his stools have gradually decreased in caliber and now has becoming very thin.  He denies weight changes.  He denies family history and personal history of cancer.  Social history: works in Production designer, theatre/television/film, he formerly smoked tobacco, quit 2 years. He endorses infequent etoh consumption and quit 2 years.   Surgical history: gallbladder removal, about 7 years ago.  Vaccinations: he is vaccinated for covid with two doses.   ROS: Constitutional: no weight change, no fever ENT/Mouth: no sore throat, no rhinorrhea Eyes: no eye pain, no vision changes Cardiovascular: no chest pain, no dyspnea, no edema, no palpitations Respiratory: no cough, no sputum, no wheezing Gastrointestinal: + nausea, + vomiting, no diarrhea, no constipation Genitourinary: no urinary incontinence, no dysuria, no hematuria Musculoskeletal: no arthralgias, no myalgias Skin:  no skin lesions, no pruritus, Neuro:+weakness, no loss of consciousness, no syncope Psych: no anxiety, no depression,+decrease appetite Heme/Lymph: no bruising, no bleeding  ED Course: Discussed with ED provider, patient requiring hospitalization due to small bowel obstruction and right upper quadrant arterial occlusion.  ED  provider discussed with general surgery, Dr. Maia Plan and vascular surgeon Dr. Gilda Crease. Patient will be taken to the OR by Dr. Gilda Crease first.   Heparin gtt was started.   Hospital Course:  1 right upper extremity ischemia/acute thromboembolism right upper extremity Questionable etiology.  Concern likely cardiac etiology.  Patient seen by vascular surgery patient underwent mechanical thrombectomy of the right radial and right ulnar artery with percutaneous transluminal angioplasty of the right ulnar artery to 2 mm on 07/25/2020.  Patient with clinical improvement with improvement with right upper extremity pain, numbness and mobility postoperatively.    Patient maintained on IV heparin drip.  2D echo with normal EF, no wall motion abnormalities, no source of emboli noted.  Per vascular surgery once no need for surgery could likely transition to Eliquis.  Patient will likely need screening colonoscopy done in the outpatient setting.  Patient has no further surgery was planned was subsequently transitioned to Eliquis which he tolerated.  Patient being discharged home on Eliquis with outpatient follow-up with Dr. Asencion Islam, vascular surgery in 2 weeks.  Patient was discharged in stable and improved condition.  2.  Small bowel obstruction Patient presented with abdominal pain, CT abdomen and pelvis consistent with a small bowel obstruction.    General surgery was consulted and followed the patient.  Patient treated conservatively.  Serial abdominal films obtained.  Abdominal film showed improvement with contrast noted throughout the colon and the rectum.  Patient started passing flatus and having bowel movements.  Patient was placed on a clear liquid diet and diet advanced to a soft liquid diet which he tolerated.  Patient had no further abdominal pain.  Patient's small bowel obstruction has resolved by day of discharge.  Outpatient follow-up with PCP.   3.  Hypertension Blood pressure noted to be elevated.   Patient with no prior history of hypertension.  On IV Lopressor while he was n.p.o.  Patient started on clears and started on Norvasc 5 mg daily for better blood pressure control.  IV Lopressor discontinued.  Patient be discharged on Norvasc 5 mg daily with outpatient follow-up with PCP for further blood pressure management and titration.    4.  Leukocytosis Likely secondary to problem #1 and #2.  Likely reactive.  Patient remained afebrile.  Leukocytosis trending down.  No respiratory symptoms.  No urinary symptoms.  No need for antibiotics.  Supportive care.   5.  Right mid kidney with wedge-shaped area of decreased enhancement Per CT concerning for infarct versus focal pyelonephritis.  Likely secondary to thromboembolism.  Renal function stable.  Patient with no CVA tenderness.  No urinary symptoms.  2D echo done with EF 60 to 65%, no wall motion abnormalities, no source of emboli noted.    Patient was on IV heparin during the hospitalization and transition to Eliquis.  Outpatient follow-up.   6.  Obesity BMI 37.09 kg/m2   Procedures:  CT angiogram right upper extremity 07/25/2020  CT angiogram chest abdomen and pelvis 07/25/2020  Abdominal films 07/26/2020  2D echo pending 07/26/2020  Upper extremity angiogram 07/25/2020  Heart aortogram/right upper extremity angiography third order catheter placement with additional third order/mechanical thrombectomy right radial artery/mechanical thrombectomy right ulnar artery/percutaneous transluminal angioplasty right ulnar artery to 2  mm/[employer] right common femoral artery per Dr.Schnier, vascular surgery 07/25/2020   Consultations:  General surgery: Dr. Hazle Quant 07/25/2020  Vascular surgery: Dr. Gilda Crease 07/25/2020   Discharge Exam: Vitals:   07/28/20 0732 07/28/20 1214  BP: 121/76 (!) 150/89  Pulse: 67 75  Resp: 20 20  Temp: 98 F (36.7 C) 98.5 F (36.9 C)  SpO2: 97% 100%    General: NAD Cardiovascular: RRR Respiratory:  CTAB  Discharge Instructions   Discharge Instructions    Diet - low sodium heart healthy   Complete by: As directed    Increase activity slowly   Complete by: As directed      Allergies as of 07/28/2020   No Known Allergies     Medication List    TAKE these medications   amLODipine 5 MG tablet Commonly known as: NORVASC Take 1 tablet (5 mg total) by mouth daily. Start taking on: July 29, 2020   apixaban 5 MG Tabs tablet Commonly known as: ELIQUIS Take 2 tablets (10 mg total) by mouth 2 (two) times daily for 6 days.   apixaban 5 MG Tabs tablet Commonly known as: ELIQUIS Take 1 tablet (5 mg total) by mouth 2 (two) times daily. Start taking on: August 03, 2020   atorvastatin 10 MG tablet Commonly known as: LIPITOR Take 1 tablet (10 mg total) by mouth daily at 6 PM.            Durable Medical Equipment  (From admission, onward)         Start     Ordered   07/27/20 0804  For home use only DME 3 n 1  Once        07/27/20 0806         No Known Allergies  Follow-up Information    Margaretann Loveless, MD. Go on 08/08/2020.   Specialty: Internal Medicine Why: Harlow Mares cita de atencin primaria. Por favor llegue a la 1:30 para la cita de las 2:00. Elsworth Soho su identificacin con foto, tarjeta de seguro y Earline Mayotte. Contact information: [address] [place] [address] [phone]        Schnier, Latina Craver, MD. Schedule an appointment as soon as possible for a visit in 2 week(s).   Specialties: Vascular Surgery, Cardiology, Radiology, Vascular Surgery Contact information: [address] [place] [address] [phone]                The results of significant diagnostics from this hospitalization (including imaging, microbiology, ancillary and laboratory) are listed below for reference.    Significant Diagnostic Studies: CT ANGIO UP EXTREM RIGHT W &/OR WO CONTRAST  Result Date: 07/25/2020 CLINICAL DATA:  Concern for right upper extremity  acute ischemia, diminished radial pulse EXAM: CT ANGIOGRAPHY UPPER RIGHT EXTREMITY TECHNIQUE: Multidetector CT imaging performed of the right upper extremity as a CTA exam. Multiplanar reconstruction images and MIPS were obtained to evaluate the vascular anatomy. CONTRAST:  OMNIPAQUE IOHEXOL 350 MG/ML SOLN COMPARISON:  07/25/2020 FINDINGS: Vascular: Visualized right subclavian and axillary arteries are patent. In the upper arm the brachial artery is patent. No proximal dissection. At the elbow, there is abrupt truncation of the brachial artery at or very close to the bifurcation of the radial and ulnar arteries compatible with acute arterial occlusion, appearance suggest thromboembolism. Very minimal reconstitution of the forearm and wrist arterial system to confirm distal vasculature patency. Nonvascular: No focal soft tissue abnormality or swelling of the upper extremity. No acute osseous finding. Review of the MIP  images confirms the above findings. IMPRESSION: Findings consistent with acute arterial occlusion of the distal brachial artery at the elbow, suspect thrombo embolus. These results were called by telephone at the time of interpretation on 07/25/2020 at 11:49 am to provider Willy Eddy , who verbally acknowledged these results. Electronically Signed   By: Judie Petit.  Shick M.D.   On: 07/25/2020 11:50   PERIPHERAL VASCULAR CATHETERIZATION  Result Date: 07/25/2020 See Op note  DG Abd 2 Views  Result Date: 07/26/2020 CLINICAL DATA:  Bowel obstruction EXAM: ABDOMEN - 2 VIEW COMPARISON:  CT angio 07/25/2020 FINDINGS: Nasogastric tube coiled in proximal stomach. Few mildly prominent air-filled loops of small bowel in the LEFT mid abdomen. These have slightly decreased in caliber and number since previous exam. Excreted contrast within urinary bladder. No bowel wall thickening or free air. Minimal atelectasis at lung bases. Sclerotic focus at the LEFT femoral head unchanged, nonspecific Osseous  structures otherwise unremarkable. IMPRESSION: Decreased small bowel distension since prior exam. Electronically Signed   By: Ulyses Southward M.D.   On: 07/26/2020 08:31   DG Abd Portable 1V-Small Bowel Obstruction Protocol-initial, 8 hr delay  Result Date: 07/27/2020 CLINICAL DATA:  Small-bowel obstruction. EXAM: PORTABLE ABDOMEN - 1 VIEW COMPARISON:  07/26/2020.  CT 07/25/2020. FINDINGS: NG tube noted with tip in the stomach. Interval near complete resolution of small-bowel distention. Oral contrast noted throughout the colon and rectum. Hemidiaphragms not imaged. No free air identified. Stable sclerotic density in the left femur, possibly a bone island. No other focal bony abnormalities identified. IMPRESSION: 1. NG tube noted with tip in the stomach. 2. Interval near complete resolution of small-bowel distention. Oral contrast noted throughout the colon and rectum. Electronically Signed   By: Maisie Fus  Register   On: 07/27/2020 05:44   ECHOCARDIOGRAM COMPLETE  Result Date: 07/26/2020    ECHOCARDIOGRAM REPORT   Patient Name:   Jesse Chapman [facility] Date of Exam: 07/26/2020 Medical Rec #:  [id number]             Height:       64.0 in Accession #:    [id number]            Weight:       216.0 lb Date of Birth:  11-11-1965             BSA:          2.022 m Patient Age:    54 years              BP:           151/90 mmHg Patient Gender: M                     HR:           79 bpm. Exam Location:  [facility] Procedure: 2D Echo, Cardiac Doppler and Color Doppler Indications:     Thromboembolism  History:         Patient has no prior history of Echocardiogram examinations. No                  medical history on file.  Sonographer:     Cristela Blue RDCS (AE) Referring Phys:  [id number] Rodolph Bong Diagnosing Phys: Debbe Odea MD IMPRESSIONS  1. Left ventricular ejection fraction, by estimation, is 60 to 65%. The left ventricle has normal function. The left ventricle has no regional wall motion abnormalities. Left ventricular  diastolic parameters were normal.  2. Right ventricular systolic  function is normal. The right ventricular size is normal.  3. The mitral valve is normal in structure. No evidence of mitral valve regurgitation. No evidence of mitral stenosis.  4. The aortic valve is grossly normal. Aortic valve regurgitation is not visualized. No aortic stenosis is present. FINDINGS  Left Ventricle: Left ventricular ejection fraction, by estimation, is 60 to 65%. The left ventricle has normal function. The left ventricle has no regional wall motion abnormalities. The left ventricular internal cavity size was normal in size. There is  no left ventricular hypertrophy. Left ventricular diastolic parameters were normal. Right Ventricle: The right ventricular size is normal. No increase in right ventricular wall thickness. Right ventricular systolic function is normal. Left Atrium: Left atrial size was normal in size. Right Atrium: Right atrial size was normal in size. Pericardium: There is no evidence of pericardial effusion. Mitral Valve: The mitral valve is normal in structure. No evidence of mitral valve regurgitation. No evidence of mitral valve stenosis. Tricuspid Valve: The tricuspid valve is normal in structure. Tricuspid valve regurgitation is not demonstrated. No evidence of tricuspid stenosis. Aortic Valve: The aortic valve is grossly normal. Aortic valve regurgitation is not visualized. No aortic stenosis is present. Aortic valve mean gradient measures 4.0 mmHg. Aortic valve peak gradient measures 7.4 mmHg. Aortic valve area, by VTI measures 2.28 cm. Pulmonic Valve: The pulmonic valve was normal in structure. Pulmonic valve regurgitation is not visualized. No evidence of pulmonic stenosis. Aorta: The aortic root is normal in size and structure. Venous: The inferior vena cava was not well visualized. IAS/Shunts: No atrial level shunt detected by color flow Doppler.  LEFT VENTRICLE PLAX 2D LVIDd:         4.22 cm  Diastology  LVIDs:         3.00 cm  LV e' medial:    6.53 cm/s LV PW:         1.54 cm  LV E/e' medial:  12.6 LV IVS:        0.88 cm  LV e' lateral:   7.72 cm/s LVOT diam:     2.10 cm  LV E/e' lateral: 10.7 LV SV:         50 LV SV Index:   24 LVOT Area:     3.46 cm  RIGHT VENTRICLE RV Basal diam:  3.28 cm RV S prime:     11.20 cm/s TAPSE (M-mode): 3.7 cm LEFT ATRIUM             Index       RIGHT ATRIUM           Index LA diam:        3.40 cm 1.68 cm/m  RA Area:     19.30 cm LA Vol (A2C):   50.2 ml 24.83 ml/m RA Volume:   52.80 ml  26.12 ml/m LA Vol (A4C):   41.2 ml 20.38 ml/m LA Biplane Vol: 44.1 ml 21.81 ml/m  AORTIC VALVE                   PULMONIC VALVE AV Area (Vmax):    2.14 cm    PV Vmax:        1.01 m/s AV Area (Vmean):   2.11 cm    PV Peak grad:   4.1 mmHg AV Area (VTI):     2.28 cm    RVOT Peak grad: 4 mmHg AV Vmax:           136.00 cm/s AV Vmean:  95.700 cm/s AV VTI:            0.217 m AV Peak Grad:      7.4 mmHg AV Mean Grad:      4.0 mmHg LVOT Vmax:         84.20 cm/s LVOT Vmean:        58.300 cm/s LVOT VTI:          0.143 m LVOT/AV VTI ratio: 0.66  AORTA Ao Root diam: 3.37 cm MITRAL VALVE               TRICUSPID VALVE MV Area (PHT): 4.31 cm    TR Peak grad:   11.7 mmHg MV Decel Time: 176 msec    TR Vmax:        171.00 cm/s MV E velocity: 82.30 cm/s MV A velocity: 66.40 cm/s  SHUNTS MV E/A ratio:  1.24        Systemic VTI:  0.14 m                            Systemic Diam: 2.10 cm Debbe Odea MD Electronically signed by Debbe Odea MD Signature Date/Time: 07/26/2020/4:26:22 PM    Final    CT Angio Chest/Abd/Pel for Dissection W and/or Wo Contrast  Result Date: 07/25/2020 CLINICAL DATA:  Numbness in right hand.  Vomiting and sweating. EXAM: CT ANGIOGRAPHY CHEST, ABDOMEN AND PELVIS TECHNIQUE: Non-contrast CT of the chest was initially obtained. Multidetector CT imaging through the chest, abdomen and pelvis was performed using the standard protocol during bolus administration of  intravenous contrast. Multiplanar reconstructed images and MIPs were obtained and reviewed to evaluate the vascular anatomy. CONTRAST:  OMNIPAQUE IOHEXOL 350 MG/ML SOLN COMPARISON:  None. FINDINGS: CTA CHEST FINDINGS Cardiovascular: Heart size appears normal. No pericardial effusion. No signs of thoracic aortic dissection. Aortic atherosclerosis. Several irregular noncalcified atherosclerotic plaques are identified arising off the wall of the transverse aortic arch, image 26/4 and image 27/4. Mediastinum/Nodes: No enlarged mediastinal, hilar, or axillary lymph nodes. Thyroid gland, trachea, and esophagus demonstrate no significant findings. Lungs/Pleura: No pleural effusion. No airspace consolidation. Mild ground-glass attenuation is identified in both lower lobes and right middle lobe. Musculoskeletal: No chest wall abnormality. No acute or significant osseous findings. Review of the MIP images confirms the above findings. CTA ABDOMEN AND PELVIS FINDINGS VASCULAR Aorta: Normal caliber aorta without aneurysm, dissection, vasculitis or significant stenosis. Celiac: Mild stenosis at the origin of the left gastric artery which arises directly off the aorta (proximal to the celiac artery). There is also mild stenosis of the origin of the celiac artery. SMA: Patent without evidence of aneurysm, dissection, vasculitis or significant stenosis. Renals: Both renal arteries are patent without evidence of aneurysm, dissection, vasculitis, fibromuscular dysplasia or significant stenosis. IMA: Patent without evidence of aneurysm, dissection, vasculitis or significant stenosis. Inflow: Patent without evidence of aneurysm, dissection, vasculitis or significant stenosis. Veins: No obvious venous abnormality within the limitations of this arterial phase study. Review of the MIP images confirms the above findings. NON-VASCULAR Hepatobiliary: No focal liver abnormality is seen. Status post cholecystectomy. No biliary dilatation.  Pancreas: Unremarkable. No pancreatic ductal dilatation or surrounding inflammatory changes. Spleen: Normal in size without focal abnormality. Adrenals/Urinary Tract: Normal appearance of the adrenal glands. Wedge-shaped area of decreased enhancement is identified within the lateral cortex of the right mid kidney, image 94/4. Left kidney is unremarkable. No hydronephrosis identified bilaterally. Urinary bladder is normal. Stomach/Bowel: Small hiatal hernia. The  stomach appears distended. The small bowel loops are abnormally dilated with multiple air-fluid levels. These measure up to 3.1 cm. There is a transition point to decreased caliber terminal ileum within the lower abdomen, image 153/4. No signs of pneumatosis, bowel perforation or abscess. Scattered colonic diverticula noted. Lymphatic: No abdominopelvic adenopathy. Reproductive: Prostate is unremarkable. Other: No free fluid or fluid collections. Musculoskeletal: No acute or significant osseous findings. Review of the MIP images confirms the above findings. IMPRESSION: 1. No evidence for aortic dissection. There is aortic atherosclerosis with several small irregular noncalcified atherosclerotic plaques arising off the wall of the transverse aortic arch. 2. Examination is positive for small bowel obstruction. Transition point to decreased caliber terminal ileum within the lower abdomen. Long segment of terminal ileum stricture may reflect sequelae of inflammatory/infectious enteritis versus ischemic stricture. The SMA and its branches however appears patent. No signs of pneumatosis or portal venous gas. 3. Wedge-shaped area of decreased enhancement within the lateral cortex of the right mid kidney concerning for infarct versus focal pyelonephritis. Aortic Atherosclerosis (ICD10-I70.0). Critical Value/emergent results were called by telephone at the time of interpretation on 07/25/2020 at 11:21 am to provider Willy EddyPATRICK ROBINSON , who verbally acknowledged these  results. Electronically Signed   By: Signa Kellaylor  Stroud M.D.   On: 07/25/2020 11:21    Microbiology: Recent Results (from the past 240 hour(s))  Resp Panel by RT-PCR (Flu A&B, Covid) Nasopharyngeal Swab     Status: None   Collection Time: 07/25/20 10:21 AM   Specimen: Nasopharyngeal Swab; Nasopharyngeal(NP) swabs in vial transport medium  Result Value Ref Range Status   SARS Coronavirus 2 by RT PCR NEGATIVE NEGATIVE Final    Comment: (NOTE) SARS-CoV-2 target nucleic acids are NOT DETECTED.  The SARS-CoV-2 RNA is generally detectable in upper respiratory specimens during the acute phase of infection. The lowest concentration of SARS-CoV-2 viral copies this assay can detect is 138 copies/mL. A negative result does not preclude SARS-Cov-2 infection and should not be used as the sole basis for treatment or other patient management decisions. A negative result may occur with  improper specimen collection/handling, submission of specimen other than nasopharyngeal swab, presence of viral mutation(s) within the areas targeted by this assay, and inadequate number of viral copies(<138 copies/mL). A negative result must be combined with clinical observations, patient history, and epidemiological information. The expected result is Negative.  Fact Sheet for Patients:  BloggerCourse.comhttps://www.fda.gov/media/152166/download  Fact Sheet for Healthcare Providers:  SeriousBroker.ithttps://www.fda.gov/media/152162/download  This test is no t yet approved or cleared by the [place] FDA and  has been authorized for detection and/or diagnosis of SARS-CoV-2 by FDA under an Emergency Use Authorization (EUA). This EUA will remain  in effect (meaning this test can be used) for the duration of the COVID-19 declaration under Section 564(b)(1) of the Act, 21 U.S.C.section 360bbb-3(b)(1), unless the authorization is terminated  or revoked sooner.       Influenza A by PCR NEGATIVE NEGATIVE Final   Influenza B by PCR NEGATIVE  NEGATIVE Final    Comment: (NOTE) The Xpert Xpress SARS-CoV-2/FLU/RSV plus assay is intended as an aid in the diagnosis of influenza from Nasopharyngeal swab specimens and should not be used as a sole basis for treatment. Nasal washings and aspirates are unacceptable for Xpert Xpress SARS-CoV-2/FLU/RSV testing.  Fact Sheet for Patients: BloggerCourse.comhttps://www.fda.gov/media/152166/download  Fact Sheet for Healthcare Providers: SeriousBroker.ithttps://www.fda.gov/media/152162/download  This test is not yet approved or cleared by the [place] FDA and has been authorized for detection and/or diagnosis of SARS-CoV-2 by  FDA under an Emergency Use Authorization (EUA). This EUA will remain in effect (meaning this test can be used) for the duration of the COVID-19 declaration under Section 564(b)(1) of the Act, 21 U.S.C. section 360bbb-3(b)(1), unless the authorization is terminated or revoked.  Performed at [facility], [address] [place], [place] [address]      Labs: Basic Metabolic Panel: Recent Labs  Lab 07/25/20 1021 07/26/20 0429 07/27/20 0339 07/28/20 0356  NA 139 139 140 138  K 4.0 3.7 3.8 3.6  CL 101 103 107 106  CO2 24 26 25 23   GLUCOSE 162* 112* 126* 127*  BUN 23* 27* 16 13  CREATININE 1.07 1.09 0.78 0.77  CALCIUM 9.4 8.6* 8.4* 8.3*  MG  --   --  2.0 2.0   Liver Function Tests: Recent Labs  Lab 07/25/20 1021  AST 29  ALT 41  ALKPHOS 58  BILITOT 1.0  PROT 7.7  ALBUMIN 4.5   No results for input(s): LIPASE, AMYLASE in the last 168 hours. No results for input(s): AMMONIA in the last 168 hours. CBC: Recent Labs  Lab 07/25/20 1021 07/26/20 0429 07/27/20 0339 07/28/20 0356  WBC 18.1* 13.0* 10.7* 7.8  NEUTROABS 16.4*  --   --   --   HGB 15.7 12.5* 12.6* 11.9*  HCT 44.5 35.6* 35.1* 33.0*  MCV 85.7 86.0 86.0 85.7  PLT 363 341 293 277   Cardiac Enzymes: No results for input(s): CKTOTAL, CKMB, CKMBINDEX, TROPONINI in the last 168 hours. BNP: BNP (last 3  results) No results for input(s): BNP in the last 8760 hours.  ProBNP (last 3 results) No results for input(s): PROBNP in the last 8760 hours.  CBG: No results for input(s): GLUCAP in the last 168 hours.     Signed:  Ramiro Harvest MD.  [facility] 07/28/2020, 4:08 PM

## 2020-07-28 NOTE — TOC Transition Note (Signed)
Transition of Care [facility]) - CM/SW Discharge Note   Patient Details  Name: Jesse Chapman MRN: [id number] Date of Birth: [birth year]  Transition of [facility]) CM/SW Contact:  Margarito Liner, LCSW Phone Number: 07/28/2020, 3:59 PM   Clinical Narrative: Patient has orders to discharge home today. Pharmacist provided patient with and discussed Eliquis coupons: 30-day free card and $10 copay card. No further concerns. CSW signing off.    Final next level of care: Home/Self Care Barriers to Discharge: Barriers Resolved   Patient Goals and CMS Choice        Discharge Placement                    Patient and family notified of of transfer: 07/28/20  Discharge Plan and Services                                     Social Determinants of Health (SDOH) Interventions     Readmission Risk Interventions No flowsheet data found.

## 2020-07-28 NOTE — TOC Progression Note (Addendum)
Transition of Care [facility]) - Progression Note    Patient Details  Name: Leona Pressly MRN: [id number] Date of Birth: 07-Jan-1966  Transition of Care [facility]) CM/SW Contact  Margarito Liner, LCSW Phone Number: 07/28/2020, 9:43 AM  Clinical Narrative:  New patient appointment scheduled with Yves Dill, MD at [facility] on Tuesday February 1. Information added to AVS. Left voicemail for daughter. Will provide information when she calls back.  11:54 am: 30-day free trial card for Eliquis also put in discharge packet.  Expected Discharge Plan: Home/Self Care Barriers to Discharge: Continued Medical Work up  Expected Discharge Plan and Services Expected Discharge Plan: Home/Self Care       Living arrangements for the past 2 months: Single Family Home                                       Social Determinants of Health (SDOH) Interventions    Readmission Risk Interventions No flowsheet data found.

## 2020-07-28 NOTE — Plan of Care (Signed)
Discharge order received. Patient mental status is at baseline. Vital signs stable . No signs of acute distress. Discharge instructions given. Patient verbalized understanding. No other issues noted at this time.   

## 2020-08-09 DIAGNOSIS — E785 Hyperlipidemia, unspecified: Secondary | ICD-10-CM | POA: Insufficient documentation

## 2020-08-10 ENCOUNTER — Ambulatory Visit (INDEPENDENT_AMBULATORY_CARE_PROVIDER_SITE_OTHER): Payer: Self-pay | Admitting: Vascular Surgery

## 2020-08-11 ENCOUNTER — Other Ambulatory Visit: Payer: Self-pay

## 2020-08-11 ENCOUNTER — Ambulatory Visit (INDEPENDENT_AMBULATORY_CARE_PROVIDER_SITE_OTHER): Payer: 59 | Admitting: Nurse Practitioner

## 2020-08-11 ENCOUNTER — Encounter (INDEPENDENT_AMBULATORY_CARE_PROVIDER_SITE_OTHER): Payer: Self-pay | Admitting: Nurse Practitioner

## 2020-08-11 VITALS — BP 144/82 | HR 78 | Resp 16 | Wt 192.8 lb

## 2020-08-11 DIAGNOSIS — I7090 Unspecified atherosclerosis: Secondary | ICD-10-CM

## 2020-08-11 DIAGNOSIS — I10 Essential (primary) hypertension: Secondary | ICD-10-CM

## 2020-08-11 NOTE — Progress Notes (Signed)
Subjective:    Patient ID: Jesse Chapman, male    DOB: [birth year], 55 y.o.   MRN: [id number] Chief Complaint  Patient presents with  . Follow-up    [facility] 2 week post ue angio    The patient follows up today for evaluation of right upper extremity angiogram done on 07/25/2020. Prior to this intervention the patient had a fairly unremarkable medical history. The patient notes that the symptoms started when he vomited that morning and he began to have sudden pain in his upper extremity. His hand and forearm feel cold and began to have worsening numbness and discoloration. He denies ever having this issue previously. He denies any recent injury or trauma. He denies any known clotting disorders. He notes that previously he has had some palpitations but they were mostly transient. The patient had a history of hypertension but it was unknown if he was taking his hypertension medications at that time. The patient is a former smoker but not currently smoking.  Procedure(s) Performed:             1.  Arch aortogram             2.  Right upper extremity angiography third order catheter placement with additional third order             3.  Mechanical thrombectomy right radial artery             4.  Mechanical thrombectomy right ulnar artery             5.  Percutaneous transluminal angioplasty right ulnar artery to 2 mm             6.  Star close right common femoral artery  Following the patient's intervention he denies any swelling of his hand or upper extremity but he does endorse having sharp pains in his palm for about 2 days. However that has resolved. He is not had any issues with weakness or difficulty using his hand since then. Overall he states that he feels very good. He also endorses taking his medications as prescribed.   Review of Systems  Cardiovascular: Negative for palpitations.  Hematological: Does not bruise/bleed easily.  All other systems reviewed and are negative.       Objective:   Physical Exam Vitals reviewed.  Cardiovascular:     Rate and Rhythm: Normal rate and regular rhythm.     Pulses: Normal pulses.          Radial pulses are 2+ on the right side.     Heart sounds: Normal heart sounds.  Pulmonary:     Effort: Pulmonary effort is normal.  Skin:    General: Skin is warm and dry.  Neurological:     Mental Status: He is alert and oriented to person, place, and time.  Psychiatric:        Mood and Affect: Mood normal.        Behavior: Behavior normal.        Thought Content: Thought content normal.        Judgment: Judgment normal.     Resp 16   Wt 192 lb 12.8 oz (87.5 kg)   BMI 33.09 kg/m   Past Medical History:  Diagnosis Date  . Hypertension     Social History   Socioeconomic History  . Marital status: Single    Spouse name: Not on file  . Number of children: Not on file  . Years of education:  Not on file  . Highest education level: Not on file  Occupational History  . Not on file  Tobacco Use  . Smoking status: Former Games developer  . Smokeless tobacco: Never Used  Vaping Use  . Vaping Use: Former  Substance and Sexual Activity  . Alcohol use: Never  . Drug use: Never  . Sexual activity: Not on file  Other Topics Concern  . Not on file  Social History Narrative  . Not on file   Social Determinants of Health   Financial Resource Strain: Not on file  Food Insecurity: Not on file  Transportation Needs: Not on file  Physical Activity: Not on file  Stress: Not on file  Social Connections: Not on file  Intimate Partner Violence: Not on file    Past Surgical History:  Procedure Laterality Date  . GALLBLADDER SURGERY    . UPPER EXTREMITY ANGIOGRAPHY Right 07/25/2020   Procedure: Upper Extremity Angiography;  Surgeon: Renford Dills, MD;  Location: [facility] INVASIVE CV LAB;  Service: Cardiovascular;  Laterality: Right;    Family History  Problem Relation Age of Onset  . Healthy Mother   . Healthy Father     No  Known Allergies  CBC Latest Ref Rng & Units 07/28/2020 07/27/2020 07/26/2020  WBC 4.0 - 10.5 K/uL 7.8 10.7(H) 13.0(H)  Hemoglobin 13.0 - 17.0 g/dL 11.9(L) 12.6(L) 12.5(L)  Hematocrit 39.0 - 52.0 % 33.0(L) 35.1(L) 35.6(L)  Platelets 150 - 400 K/uL 277 293 341      CMP     Component Value Date/Time   NA 138 07/28/2020 0356   K 3.6 07/28/2020 0356   CL 106 07/28/2020 0356   CO2 23 07/28/2020 0356   GLUCOSE 127 (H) 07/28/2020 0356   BUN 13 07/28/2020 0356   CREATININE 0.77 07/28/2020 0356   CALCIUM 8.3 (L) 07/28/2020 0356   PROT 7.7 07/25/2020 1021   ALBUMIN 4.5 07/25/2020 1021   AST 29 07/25/2020 1021   ALT 41 07/25/2020 1021   ALKPHOS 58 07/25/2020 1021   BILITOT 1.0 07/25/2020 1021   GFRNONAA >60 07/28/2020 0356   GFRAA >60 08/31/2019 1840     No results found.     Assessment & Plan:   1. Arterial occlusion The patient underwent a successful thrombectomy of the right upper extremity with retrieval of thrombus on 07/25/2020. Today the patient is doing much better as well as doing well with his anticoagulation. Based on the appearance of the material it is noted in the angiogram report that this thrombus is likely related to a cardiac abrasion. Given the patient's hospitalization he did have a echocardiogram which showed no evidence of atrial shunting however this was not a contrast enhanced echocardiogram. The patient has also noted previous palpitations which may be occluded possible paroxysmal atrial fibrillation. We will refer the patient to cardiology for possible work-up of cardiac causes such as atrial fibrillation or a PFO. We will also refer patient to hematology for further work-up of certain hypercoagulable disorders. Otherwise we will continue to follow patient from a vascular standpoint and have him follow-up with noninvasive studies in 3 months. He is advised to contact our office if his symptoms suddenly reemerge. - Ambulatory referral to Cardiology - Ambulatory  referral to Hematology  2. Hypertension, unspecified type Continue antihypertensive medications as already ordered, these medications have been reviewed and there are no changes at this time.    Current Outpatient Medications on File Prior to Visit  Medication Sig Dispense Refill  . amLODipine (NORVASC)  5 MG tablet Take 1 tablet (5 mg total) by mouth daily. 30 tablet 1  . apixaban (ELIQUIS) 5 MG TABS tablet Take 1 tablet (5 mg total) by mouth 2 (two) times daily. 60 tablet 2  . atorvastatin (LIPITOR) 10 MG tablet Take 1 tablet (10 mg total) by mouth daily at 6 PM. 30 tablet 1  . apixaban (ELIQUIS) 5 MG TABS tablet Take 2 tablets (10 mg total) by mouth 2 (two) times daily for 6 days. 24 tablet 0   No current facility-administered medications on file prior to visit.    There are no Patient Instructions on file for this visit. No follow-ups on file.   Georgiana Spinner, NP

## 2020-08-21 ENCOUNTER — Ambulatory Visit: Payer: 59 | Admitting: Cardiology

## 2020-08-23 ENCOUNTER — Inpatient Hospital Stay: Payer: 59

## 2020-08-23 ENCOUNTER — Inpatient Hospital Stay: Payer: 59 | Attending: Oncology | Admitting: Oncology

## 2020-08-31 ENCOUNTER — Other Ambulatory Visit: Payer: Self-pay

## 2020-08-31 ENCOUNTER — Telehealth: Payer: Self-pay | Admitting: Cardiology

## 2020-08-31 ENCOUNTER — Ambulatory Visit (INDEPENDENT_AMBULATORY_CARE_PROVIDER_SITE_OTHER): Payer: 59

## 2020-08-31 ENCOUNTER — Ambulatory Visit (INDEPENDENT_AMBULATORY_CARE_PROVIDER_SITE_OTHER): Payer: 59 | Admitting: Cardiology

## 2020-08-31 ENCOUNTER — Encounter: Payer: Self-pay | Admitting: Cardiology

## 2020-08-31 VITALS — BP 110/78 | HR 72 | Wt 193.2 lb

## 2020-08-31 DIAGNOSIS — I749 Embolism and thrombosis of unspecified artery: Secondary | ICD-10-CM

## 2020-08-31 DIAGNOSIS — I739 Peripheral vascular disease, unspecified: Secondary | ICD-10-CM

## 2020-08-31 DIAGNOSIS — E7800 Pure hypercholesterolemia, unspecified: Secondary | ICD-10-CM | POA: Diagnosis not present

## 2020-08-31 DIAGNOSIS — I10 Essential (primary) hypertension: Secondary | ICD-10-CM | POA: Diagnosis not present

## 2020-08-31 MED ORDER — APIXABAN 5 MG PO TABS: 5.0000 mg | ORAL_TABLET | Freq: Two times a day (BID) | ORAL | 5 refills | Status: DC

## 2020-08-31 NOTE — Telephone Encounter (Signed)
Called patient back and spoke with daughter. They went to pick up the refill on patients Eliquis and was told they could not use the $10 Copay card that was given to them at the hospital. The patient only has 2 days of Eliquis left. I left a weeks worth of samples at the front dest for the patient to pick up, cancelled the hospitals prescription, and ordered it under Dr. Azucena Cecil so our office could do the prior Auth. Daughter stated she would also have the patient sign a DPR when he picks up his medication so that we have permission to talk with her. He gave me a one time verbal okay today.  They were grateful for the call back.

## 2020-08-31 NOTE — Patient Instructions (Addendum)
Medication Instructions:  Your physician recommends that you continue on your current medications as directed. Please refer to the Current Medication list given to you today.  *If you need a refill on your cardiac medications before your next appointment, please call your pharmacy*   Lab Work: None Ordered    Testing/Procedures:  1.  Your physician has requested that you have a lower extremity arterial exercise duplex. During this test, exercise and ultrasound are used to evaluate arterial blood flow in the legs. Allow one hour for this exam. There are no restrictions or special instructions. Your physician has requested that you have an ankle brachial index (ABI). During this test an ultrasound and blood pressure cuff are used to evaluate the arteries that supply the arms and legs with blood. Allow thirty minutes for this exam. There are no restrictions or special instructions.  2.  Your physician has recommended that you wear a Zio monitor for 2 weeks. This monitor is a medical device that records the heart's electrical activity. Doctors most often use these monitors to diagnose arrhythmias. Arrhythmias are problems with the speed or rhythm of the heartbeat. The monitor is a small device applied to your chest. You can wear one while you do your normal daily activities. While wearing this monitor if you have any symptoms to push the button and record what you felt. Once you have worn this monitor for the period of time provider prescribed (Usually 14 days), you will return the monitor device in the postage paid box. Once it is returned they will download the data collected and provide [place] with a report which the provider will then review and we will call you with those results. Important tips:  1. Avoid showering during the first 24 hours of wearing the monitor. 2. Avoid excessive sweating to help maximize wear time. 3. Do not submerge the device, no hot tubs, and no swimming pools. 4. Keep any  lotions or oils away from the patch. 5. After 24 hours you may shower with the patch on. Take brief showers with your back facing the shower head.  6. Do not remove patch once it has been placed because that will interrupt data and decrease adhesive wear time. 7. Push the button when you have any symptoms and write down what you were feeling. 8. Once you have completed wearing your monitor, remove and place into box which has postage paid and place in your outgoing mailbox.  9. If for some reason you have misplaced your box then call our office and we can provide another box and/or mail it off for you.          Follow-Up: At [facility], you and your health needs are our priority.  As part of our continuing mission to provide you with exceptional heart care, we have created designated Provider Care Teams.  These Care Teams include your primary Cardiologist (physician) and Advanced Practice Providers (APPs -  Physician Assistants and Nurse Practitioners) who all work together to provide you with the care you need, when you need it.  We recommend signing up for the patient portal called "MyChart".  Sign up information is provided on this After Visit Summary.  MyChart is used to connect with patients for Virtual Visits (Telemedicine).  Patients are able to view lab/test results, encounter notes, upcoming appointments, etc.  Non-urgent messages can be sent to your provider as well.   To learn more about what you can do with MyChart, go to [email or web address].  Your next appointment:    5-6 weeks  The format for your next appointment:   In Person  Provider:   Debbe Odea, MD   Other Instructions

## 2020-08-31 NOTE — Progress Notes (Signed)
Cardiology Office Note:    Date:  08/31/2020   ID:  Jesse Chapman, DOB September 11, 1965, MRN [id number]  PCP:  Patient, No Pcp Per   [facility]  Cardiologist:  Debbe Odea, MD  Advanced Practice Provider:  No care team member to display Electrophysiologist:  None       Referring MD: Georgiana Spinner, NP   Chief Complaint  Patient presents with  . Other    Arterial Occlusion. Meds reviewed verbally with pt.   Jesse Chapman is a 55 y.o. male who is being seen today for the evaluation of peripheral arterial disease at the request of Georgiana Spinner, NP.   History of Present Illness:    Jesse Chapman is a 55 y.o. male with a hx of hypertension, hyperlipidemia, former smoker x20 years, PAD who presents due to an arterial occlusion.  Patient recently seen in the hospital on 07/25/2020 due to right upper extremity pain and numbness.  CT angio of the right upper extremity showed an acute arterial occlusion of the distal brachial artery at the elbow, thromboembolus was suspected.  Patient underwent mechanical thrombectomy with vascular surgery.  Was started on Eliquis postprocedure.  Echocardiogram 07/26/2020 showed normal systolic and diastolic function, EF 60 to 65%.  He feels well since his procedure, denies any arm pain.  He endorses leg pain in his calves bilaterally with walking.  Symptoms improved with rest.  Denies chest pain or shortness of breath when erect or walking.  Endorses chest discomfort only when he bends over.  Past Medical History:  Diagnosis Date  . Clotting disorder (HCC)   . Hyperlipidemia   . Hypertension     Past Surgical History:  Procedure Laterality Date  . GALLBLADDER SURGERY    . UPPER EXTREMITY ANGIOGRAPHY Right 07/25/2020   Procedure: Upper Extremity Angiography;  Surgeon: Renford Dills, MD;  Location: [facility] INVASIVE CV LAB;  Service: Cardiovascular;  Laterality: Right;    Current  Medications: Current Meds  Medication Sig  . amLODipine (NORVASC) 5 MG tablet Take 1 tablet (5 mg total) by mouth daily.  Marland Kitchen apixaban (ELIQUIS) 5 MG TABS tablet Take 1 tablet (5 mg total) by mouth 2 (two) times daily.  Marland Kitchen atorvastatin (LIPITOR) 10 MG tablet Take 1 tablet (10 mg total) by mouth daily at 6 PM.     Allergies:   Patient has no known allergies.   Social History   Socioeconomic History  . Marital status: Single    Spouse name: Not on file  . Number of children: Not on file  . Years of education: Not on file  . Highest education level: Not on file  Occupational History  . Not on file  Tobacco Use  . Smoking status: Former Games developer  . Smokeless tobacco: Never Used  Vaping Use  . Vaping Use: Former  Substance and Sexual Activity  . Alcohol use: Never  . Drug use: Never  . Sexual activity: Not on file  Other Topics Concern  . Not on file  Social History Narrative  . Not on file   Social Determinants of Health   Financial Resource Strain: Not on file  Food Insecurity: Not on file  Transportation Needs: Not on file  Physical Activity: Not on file  Stress: Not on file  Social Connections: Not on file     Family History: The patient's family history includes Healthy in his father and mother.  ROS:   Please see the  history of present illness.     All other systems reviewed and are negative.  EKGs/Labs/Other Studies Reviewed:    The following studies were reviewed today:   EKG:  EKG is  ordered today.  The ekg ordered today demonstrates sinus rhythm, normal ECG.  Recent Labs: 07/25/2020: ALT 41 07/28/2020: BUN 13; Creatinine, Ser 0.77; Hemoglobin 11.9; Magnesium 2.0; Platelets 277; Potassium 3.6; Sodium 138  Recent Lipid Panel No results found for: CHOL, TRIG, HDL, CHOLHDL, VLDL, LDLCALC, LDLDIRECT   Risk Assessment/Calculations:      Physical Exam:    VS:  BP 110/78 (BP Location: Right Arm, Patient Position: Sitting, Cuff Size: Normal)   Pulse 72    Wt 193 lb 4 oz (87.7 kg)   SpO2 98%   BMI 33.17 kg/m     Wt Readings from Last 3 Encounters:  08/31/20 193 lb 4 oz (87.7 kg)  08/11/20 192 lb 12.8 oz (87.5 kg)  07/25/20 216 lb 0.8 oz (98 kg)     GEN:  Well nourished, well developed in no acute distress HEENT: Normal NECK: No JVD; No carotid bruits LYMPHATICS: No lymphadenopathy CARDIAC: RRR, no murmurs, rubs, gallops RESPIRATORY:  Clear to auscultation without rales, wheezing or rhonchi  ABDOMEN: Soft, non-tender, non-distended MUSCULOSKELETAL:  No edema; No deformity  SKIN: Warm and dry NEUROLOGIC:  Alert and oriented x 3 PSYCHIATRIC:  Normal affect   ASSESSMENT:    1. Thromboembolism (HCC)   2. Claudication (HCC)   3. Primary hypertension   4. Pure hypercholesterolemia    PLAN:    In order of problems listed above:  1. Patient with a thromboembolic event involving the right brachial artery.  Status post mechanical thrombectomy by vascular surgery.  Echocardiogram showing normal systolic and diastolic function.  We will place a cardiac monitor to evaluate any arrhythmias such as A. fib or flutter which may be contributing.  Continue Eliquis. 2. Patient describes claudication in his calves bilaterally.  Has risk factors of former smoker, hypertension, hyperlipidemia.  Get arterial ultrasound of the lower extremities. 3. Hypertension, BP controlled.  Continue Norvasc. 4. Hyperlipidemia, continue Lipitor.  We will up after cardiac monitor and peripheral arterial ultrasound..     Medication Adjustments/Labs and Tests Ordered: Current medicines are reviewed at length with the patient today.  Concerns regarding medicines are outlined above.  Orders Placed This Encounter  Procedures  . LONG TERM MONITOR (3-14 DAYS)  . EKG 12-Lead  . VAS [place] LOWER EXTREMITY ARTERIAL DUPLEX  . VAS [place] ABI WITH/WO TBI   No orders of the defined types were placed in this encounter.   Patient Instructions  Medication Instructions:  Your  physician recommends that you continue on your current medications as directed. Please refer to the Current Medication list given to you today.  *If you need a refill on your cardiac medications before your next appointment, please call your pharmacy*   Lab Work: None Ordered    Testing/Procedures:  1.  Your physician has requested that you have a lower extremity arterial exercise duplex. During this test, exercise and ultrasound are used to evaluate arterial blood flow in the legs. Allow one hour for this exam. There are no restrictions or special instructions. Your physician has requested that you have an ankle brachial index (ABI). During this test an ultrasound and blood pressure cuff are used to evaluate the arteries that supply the arms and legs with blood. Allow thirty minutes for this exam. There are no restrictions or special instructions.  2.  Your physician has recommended that you wear a Zio monitor for 2 weeks. This monitor is a medical device that records the heart's electrical activity. Doctors most often use these monitors to diagnose arrhythmias. Arrhythmias are problems with the speed or rhythm of the heartbeat. The monitor is a small device applied to your chest. You can wear one while you do your normal daily activities. While wearing this monitor if you have any symptoms to push the button and record what you felt. Once you have worn this monitor for the period of time provider prescribed (Usually 14 days), you will return the monitor device in the postage paid box. Once it is returned they will download the data collected and provide [place] with a report which the provider will then review and we will call you with those results. Important tips:  1. Avoid showering during the first 24 hours of wearing the monitor. 2. Avoid excessive sweating to help maximize wear time. 3. Do not submerge the device, no hot tubs, and no swimming pools. 4. Keep any lotions or oils away from the  patch. 5. After 24 hours you may shower with the patch on. Take brief showers with your back facing the shower head.  6. Do not remove patch once it has been placed because that will interrupt data and decrease adhesive wear time. 7. Push the button when you have any symptoms and write down what you were feeling. 8. Once you have completed wearing your monitor, remove and place into box which has postage paid and place in your outgoing mailbox.  9. If for some reason you have misplaced your box then call our office and we can provide another box and/or mail it off for you.          Follow-Up: At [facility], you and your health needs are our priority.  As part of our continuing mission to provide you with exceptional heart care, we have created designated Provider Care Teams.  These Care Teams include your primary Cardiologist (physician) and Advanced Practice Providers (APPs -  Physician Assistants and Nurse Practitioners) who all work together to provide you with the care you need, when you need it.  We recommend signing up for the patient portal called "MyChart".  Sign up information is provided on this After Visit Summary.  MyChart is used to connect with patients for Virtual Visits (Telemedicine).  Patients are able to view lab/test results, encounter notes, upcoming appointments, etc.  Non-urgent messages can be sent to your provider as well.   To learn more about what you can do with MyChart, go to [email or web address].    Your next appointment:    5-6 weeks  The format for your next appointment:   In Person  Provider:   Debbe Odea, MD   Other Instructions      Signed, Debbe Odea, MD  08/31/2020 12:26 PM     [facility]

## 2020-08-31 NOTE — Telephone Encounter (Signed)
Please call to discuss with daughter Eliquis. States patient was not able to get filled at pharmacy

## 2020-09-14 DIAGNOSIS — I749 Embolism and thrombosis of unspecified artery: Secondary | ICD-10-CM | POA: Diagnosis not present

## 2020-09-14 DIAGNOSIS — I739 Peripheral vascular disease, unspecified: Secondary | ICD-10-CM | POA: Diagnosis not present

## 2020-09-20 ENCOUNTER — Ambulatory Visit (INDEPENDENT_AMBULATORY_CARE_PROVIDER_SITE_OTHER): Payer: 59

## 2020-09-20 ENCOUNTER — Other Ambulatory Visit: Payer: Self-pay

## 2020-09-20 DIAGNOSIS — I749 Embolism and thrombosis of unspecified artery: Secondary | ICD-10-CM | POA: Diagnosis not present

## 2020-09-20 DIAGNOSIS — I739 Peripheral vascular disease, unspecified: Secondary | ICD-10-CM | POA: Diagnosis not present

## 2020-10-16 ENCOUNTER — Ambulatory Visit (INDEPENDENT_AMBULATORY_CARE_PROVIDER_SITE_OTHER): Payer: 59 | Admitting: Cardiology

## 2020-10-16 ENCOUNTER — Other Ambulatory Visit: Payer: Self-pay

## 2020-10-16 ENCOUNTER — Encounter: Payer: Self-pay | Admitting: Cardiology

## 2020-10-16 VITALS — BP 150/90 | HR 71 | Wt 193.0 lb

## 2020-10-16 DIAGNOSIS — E7800 Pure hypercholesterolemia, unspecified: Secondary | ICD-10-CM | POA: Diagnosis not present

## 2020-10-16 DIAGNOSIS — I749 Embolism and thrombosis of unspecified artery: Secondary | ICD-10-CM | POA: Diagnosis not present

## 2020-10-16 DIAGNOSIS — I10 Essential (primary) hypertension: Secondary | ICD-10-CM | POA: Diagnosis not present

## 2020-10-16 DIAGNOSIS — I739 Peripheral vascular disease, unspecified: Secondary | ICD-10-CM

## 2020-10-16 MED ORDER — ATORVASTATIN CALCIUM 10 MG PO TABS: 10.0000 mg | ORAL_TABLET | Freq: Every day | ORAL | 11 refills | Status: DC

## 2020-10-16 MED ORDER — AMLODIPINE BESYLATE 5 MG PO TABS: 5.0000 mg | ORAL_TABLET | Freq: Every day | ORAL | 11 refills | Status: DC

## 2020-10-16 MED ORDER — APIXABAN 5 MG PO TABS: 5.0000 mg | ORAL_TABLET | Freq: Two times a day (BID) | ORAL | 11 refills | Status: DC

## 2020-10-16 NOTE — Progress Notes (Signed)
Cardiology Office Note:    Date:  10/16/2020   ID:  Jesse Chapman, DOB Nov 26, 1965, MRN [id number]  PCP:  Patient, No Pcp Per (Inactive)   [facility]  Cardiologist:  Debbe Odea, MD  Advanced Practice Provider:  No care team member to display Electrophysiologist:  None       Referring MD: No ref. provider found   Chief Complaint  Patient presents with  . Other    5-6 week follow up. Meds reviewed verbally with patient.     History of Present Illness:    Jesse Chapman is a 55 y.o. male with a hx of hypertension, hyperlipidemia, former smoker x20 years, PAD who presents for follow-up.  He was last seen due to an arterial occlusion.  Cardiac monitor was placed to evaluate presence of A. fib or flutter as cause for thromboembolic event involving the right upper extremity.  He also noted symptoms consistent with claudication, calf pain upon walking.  ABI was ordered.  Currently has no new concerns, denies chest pain or shortness of breath.  Patient would like to resume exercising.  He states eating low cholesterol and low salt diet.  For some reason he has not been able to refill blood pressure and cholesterol medicine over the past month.  Has been off BP meds the past month.   Prior notes Echocardiogram 07/26/2020 showed normal systolic and diastolic function, EF 60 to 65%.  seen in the hospital on 07/25/2020 due to right upper extremity pain and numbness.  CT angio of the right upper extremity showed an acute arterial occlusion of the distal brachial artery at the elbow, thromboembolus was suspected.  Patient underwent mechanical thrombectomy with vascular surgery.  Was started on Eliquis postprocedure.   Past Medical History:  Diagnosis Date  . Clotting disorder (HCC)   . Hyperlipidemia   . Hypertension     Past Surgical History:  Procedure Laterality Date  . GALLBLADDER SURGERY    . UPPER EXTREMITY ANGIOGRAPHY Right 07/25/2020    Procedure: Upper Extremity Angiography;  Surgeon: Renford Dills, MD;  Location: [facility] INVASIVE CV LAB;  Service: Cardiovascular;  Laterality: Right;    Current Medications: Current Meds  Medication Sig  . [DISCONTINUED] apixaban (ELIQUIS) 5 MG TABS tablet Take 1 tablet (5 mg total) by mouth 2 (two) times daily.     Allergies:   Patient has no known allergies.   Social History   Socioeconomic History  . Marital status: Single    Spouse name: Not on file  . Number of children: Not on file  . Years of education: Not on file  . Highest education level: Not on file  Occupational History  . Not on file  Tobacco Use  . Smoking status: Former Games developer  . Smokeless tobacco: Never Used  Vaping Use  . Vaping Use: Former  Substance and Sexual Activity  . Alcohol use: Never  . Drug use: Never  . Sexual activity: Not on file  Other Topics Concern  . Not on file  Social History Narrative  . Not on file   Social Determinants of Health   Financial Resource Strain: Not on file  Food Insecurity: Not on file  Transportation Needs: Not on file  Physical Activity: Not on file  Stress: Not on file  Social Connections: Not on file     Family History: The patient's family history includes Healthy in his father and mother.  ROS:   Please see the history  of present illness.     All other systems reviewed and are negative.  EKGs/Labs/Other Studies Reviewed:    The following studies were reviewed today:   EKG:  EKG is  ordered today.  The ekg ordered today demonstrates sinus rhythm, normal ECG.  Recent Labs: 07/25/2020: ALT 41 07/28/2020: BUN 13; Creatinine, Ser 0.77; Hemoglobin 11.9; Magnesium 2.0; Platelets 277; Potassium 3.6; Sodium 138  Recent Lipid Panel No results found for: CHOL, TRIG, HDL, CHOLHDL, VLDL, LDLCALC, LDLDIRECT   Risk Assessment/Calculations:      Physical Exam:    VS:  BP (!) 150/90 (BP Location: Left Arm, Patient Position: Sitting, Cuff Size:  Normal)   Pulse 71   Wt 193 lb (87.5 kg)   SpO2 96%   BMI 33.13 kg/m     Wt Readings from Last 3 Encounters:  10/16/20 193 lb (87.5 kg)  08/31/20 193 lb 4 oz (87.7 kg)  08/11/20 192 lb 12.8 oz (87.5 kg)     GEN:  Well nourished, well developed in no acute distress HEENT: Normal NECK: No JVD; No carotid bruits LYMPHATICS: No lymphadenopathy CARDIAC: RRR, no murmurs, rubs, gallops RESPIRATORY:  Clear to auscultation without rales, wheezing or rhonchi  ABDOMEN: Soft, non-tender, non-distended MUSCULOSKELETAL:  No edema; No deformity  SKIN: Warm and dry NEUROLOGIC:  Alert and oriented x 3 PSYCHIATRIC:  Normal affect   ASSESSMENT:    1. Thromboembolism (HCC)   2. Claudication (HCC)   3. Primary hypertension   4. Pure hypercholesterolemia    PLAN:    In order of problems listed above:  1. Prior thromboembolic event involving the right brachial artery.  Status post mechanical thrombectomy by vascular surgery.  Echo 07/2020 showed normal systolic and diastolic function, EF 60%.  Monitor with no evidence for atrial fibrillation or atrial flutter to account for thromboembolism.  Continue Eliquis. 2. Patient describes claudication in his calves bilaterally.  Has risk factors of former smoker, hypertension, hyperlipidemia.  ABI within normal limit.  Feet appear warm.  Continue management for risk factors. 3. Hypertension, BP elevated today, due to being out of BP medication/amlodipine.  Refill/restart amlodipine 5 mg daily. 4. Hyperlipidemia, continue Lipitor.  Follow-up in 1 year.     Medication Adjustments/Labs and Tests Ordered: Current medicines are reviewed at length with the patient today.  Concerns regarding medicines are outlined above.  Orders Placed This Encounter  Procedures  . EKG 12-Lead   Meds ordered this encounter  Medications  . DISCONTD: amLODipine (NORVASC) 5 MG tablet    Sig: Take 1 tablet (5 mg total) by mouth daily.    Dispense:  30 tablet    Refill:   11  . atorvastatin (LIPITOR) 10 MG tablet    Sig: Take 1 tablet (10 mg total) by mouth daily at 6 PM.    Dispense:  30 tablet    Refill:  11  . apixaban (ELIQUIS) 5 MG TABS tablet    Sig: Take 1 tablet (5 mg total) by mouth 2 (two) times daily.    Dispense:  60 tablet    Refill:  11  . amLODipine (NORVASC) 5 MG tablet    Sig: Take 1 tablet (5 mg total) by mouth daily.    Dispense:  30 tablet    Refill:  11    Patient Instructions  Medication Instructions:  Your physician recommends that you continue on your current medications as directed. Please refer to the Current Medication list given to you today.  *If you need a refill  on your cardiac medications before your next appointment, please call your pharmacy*   Lab Work: None ordered If you have labs (blood work) drawn today and your tests are completely normal, you will receive your results only by: Marland Kitchen MyChart Message (if you have MyChart) OR . A paper copy in the mail If you have any lab test that is abnormal or we need to change your treatment, we will call you to review the results.   Testing/Procedures: None ordered   Follow-Up: At [facility], you and your health needs are our priority.  As part of our continuing mission to provide you with exceptional heart care, we have created designated Provider Care Teams.  These Care Teams include your primary Cardiologist (physician) and Advanced Practice Providers (APPs -  Physician Assistants and Nurse Practitioners) who all work together to provide you with the care you need, when you need it.  We recommend signing up for the patient portal called "MyChart".  Sign up information is provided on this After Visit Summary.  MyChart is used to connect with patients for Virtual Visits (Telemedicine).  Patients are able to view lab/test results, encounter notes, upcoming appointments, etc.  Non-urgent messages can be sent to your provider as well.   To learn more about what you can do  with MyChart, go to [email or web address].    Your next appointment:   1 year(s)  The format for your next appointment:   In Person  Provider:   Debbe Odea, MD   Other Instructions      Signed, Debbe Odea, MD  10/16/2020 12:28 PM    [facility]

## 2020-10-16 NOTE — Patient Instructions (Signed)

## 2020-10-17 ENCOUNTER — Other Ambulatory Visit: Payer: Self-pay

## 2020-10-17 MED ORDER — ATORVASTATIN CALCIUM 10 MG PO TABS: 10.0000 mg | ORAL_TABLET | Freq: Every day | ORAL | 3 refills | Status: DC

## 2020-10-17 NOTE — Addendum Note (Signed)
Addended by: Ileene Musa D on: 10/17/2020 12:36 PM   Modules accepted: Orders

## 2020-10-17 NOTE — Telephone Encounter (Signed)
Rx request sent to pharmacy.  

## 2020-10-17 NOTE — Telephone Encounter (Signed)
*  STAT* If patient is at the pharmacy, call can be transferred to refill team.   1. Which medications need to be refilled? (please list name of each medication and dose if known) Atorvastatin  2. Which pharmacy/location (including street and city if local pharmacy) is medication to be sent to? [facility] [place]  3. Do they need a 30 day or 90 day supply? 90   

## 2020-11-08 ENCOUNTER — Other Ambulatory Visit (INDEPENDENT_AMBULATORY_CARE_PROVIDER_SITE_OTHER): Payer: Self-pay | Admitting: Nurse Practitioner

## 2020-11-08 DIAGNOSIS — I739 Peripheral vascular disease, unspecified: Secondary | ICD-10-CM

## 2020-11-08 DIAGNOSIS — Z9862 Peripheral vascular angioplasty status: Secondary | ICD-10-CM

## 2020-11-09 ENCOUNTER — Ambulatory Visit (INDEPENDENT_AMBULATORY_CARE_PROVIDER_SITE_OTHER): Payer: 59

## 2020-11-09 ENCOUNTER — Encounter (INDEPENDENT_AMBULATORY_CARE_PROVIDER_SITE_OTHER): Payer: Self-pay | Admitting: Vascular Surgery

## 2020-11-09 ENCOUNTER — Other Ambulatory Visit: Payer: Self-pay

## 2020-11-09 ENCOUNTER — Ambulatory Visit (INDEPENDENT_AMBULATORY_CARE_PROVIDER_SITE_OTHER): Payer: 59 | Admitting: Vascular Surgery

## 2020-11-09 VITALS — BP 152/94 | HR 69 | Ht 66.0 in | Wt 196.0 lb

## 2020-11-09 DIAGNOSIS — Z9862 Peripheral vascular angioplasty status: Secondary | ICD-10-CM

## 2020-11-09 DIAGNOSIS — I739 Peripheral vascular disease, unspecified: Secondary | ICD-10-CM | POA: Diagnosis not present

## 2020-11-09 DIAGNOSIS — I742 Embolism and thrombosis of arteries of the upper extremities: Secondary | ICD-10-CM

## 2020-11-09 DIAGNOSIS — I10 Essential (primary) hypertension: Secondary | ICD-10-CM

## 2020-11-09 MED ORDER — APIXABAN 5 MG PO TABS: 5.0000 mg | ORAL_TABLET | Freq: Two times a day (BID) | ORAL | 11 refills | Status: DC

## 2020-11-09 NOTE — Progress Notes (Signed)
MRN : [id number]  Kirkland Figg is a 55 y.o. (1965/10/01) male who presents with chief complaint of No chief complaint on file. Marland Kitchen  History of Present Illness:   The patient returns to the office for followup and review status post angiogram with intervention on 07/25/2020.   Procedure: Mechanical thrombectomy right radial artery,  mechanical thrombectomy right ulnar artery with percutaneous transluminal angioplasty right ulnar artery to 2 mm  The patient notes improvement in the right upper extremity symptoms. No interval claudication symptoms or rest pain symptoms.  No new ulcers or wounds have occurred since the last visit.  There have been no significant changes to the patient's overall health care.  The patient denies amaurosis fugax or recent TIA symptoms. There are no recent neurological changes noted. The patient denies history of DVT, PE or superficial thrombophlebitis. The patient denies recent episodes of angina or shortness of breath.   Duplex US of the right upper extremity arterial system shows widely patent radial artery with triphasic signals  Ulnar artery is patent with monophasic signals   No outpatient medications have been marked as taking for the 11/09/20 encounter (Appointment) with Gilda Crease, Latina Craver, MD.    Past Medical History:  Diagnosis Date  . Clotting disorder (HCC)   . Hyperlipidemia   . Hypertension     Past Surgical History:  Procedure Laterality Date  . GALLBLADDER SURGERY    . UPPER EXTREMITY ANGIOGRAPHY Right 07/25/2020   Procedure: Upper Extremity Angiography;  Surgeon: Renford Dills, MD;  Location: [facility] INVASIVE CV LAB;  Service: Cardiovascular;  Laterality: Right;    Social History Social History   Tobacco Use  . Smoking status: Former Games developer  . Smokeless tobacco: Never Used  Vaping Use  . Vaping Use: Former  Substance Use Topics  . Alcohol use: Never  . Drug use: Never    Family History Family History  Problem  Relation Age of Onset  . Healthy Mother   . Healthy Father     No Known Allergies   REVIEW OF SYSTEMS (Negative unless checked)  Constitutional: [] Weight loss  [] Fever  [] Chills Cardiac: [] Chest pain   [] Chest pressure   [] Palpitations   [] Shortness of breath when laying flat   [] Shortness of breath with exertion. Vascular:  [] Pain in legs with walking   [] Pain in legs at rest  [] History of DVT   [] Phlebitis   [] Swelling in legs   [] Varicose veins   [] Non-healing ulcers Pulmonary:   [] Uses home oxygen   [] Productive cough   [] Hemoptysis   [] Wheeze  [] COPD   [] Asthma Neurologic:  [] Dizziness   [] Seizures   [] History of stroke   [] History of TIA  [] Aphasia   [] Vissual changes   [] Weakness or numbness in arm   [] Weakness or numbness in leg Musculoskeletal:   [] Joint swelling   [] Joint pain   [] Low back pain Hematologic:  [] Easy bruising  [] Easy bleeding   [] Hypercoagulable state   [] Anemic Gastrointestinal:  [] Diarrhea   [] Vomiting  [] Gastroesophageal reflux/heartburn   [] Difficulty swallowing. Genitourinary:  [] Chronic kidney disease   [] Difficult urination  [] Frequent urination   [] Blood in urine Skin:  [] Rashes   [] Ulcers  Psychological:  [] History of anxiety   []  History of major depression.  Physical Examination  There were no vitals filed for this visit. There is no height or weight on file to calculate BMI. Gen: WD/WN, NAD Head: [place]/AT, No temporalis wasting.  Ear/Nose/Throat: Hearing grossly intact, nares w/o erythema or drainage  Eyes: PER, EOMI, sclera nonicteric.  Neck: Supple, no large masses.   Pulmonary:  Good air movement, no audible wheezing bilaterally, no use of accessory muscles.  Cardiac: RRR, no JVD Vascular: right hand pink and warm Vessel Right Left  Radial Palpable Palpable  Ulnar Not Palpable Palpable  Brachial Palpable Palpable  Gastrointestinal: Non-distended. No guarding/no peritoneal signs.  Musculoskeletal: M/S 5/5 throughout.  No deformity or atrophy.   Neurologic: CN 2-12 intact. Symmetrical.  Speech is fluent. Motor exam as listed above. Psychiatric: Judgment intact, Mood & affect appropriate for pt's clinical situation. Dermatologic: No rashes or ulcers noted.  No changes consistent with cellulitis.   CBC Lab Results  Component Value Date   WBC 7.8 07/28/2020   HGB 11.9 (L) 07/28/2020   HCT 33.0 (L) 07/28/2020   MCV 85.7 07/28/2020   PLT 277 07/28/2020    BMET    Component Value Date/Time   NA 138 07/28/2020 0356   K 3.6 07/28/2020 0356   CL 106 07/28/2020 0356   CO2 23 07/28/2020 0356   GLUCOSE 127 (H) 07/28/2020 0356   BUN 13 07/28/2020 0356   CREATININE 0.77 07/28/2020 0356   CALCIUM 8.3 (L) 07/28/2020 0356   GFRNONAA >60 07/28/2020 0356   GFRAA >60 08/31/2019 1840   CrCl cannot be calculated (Patient's most recent lab result is older than the maximum 21 days allowed.).  COAG No results found for: INR, PROTIME  Radiology No results found.   Assessment/Plan 1. 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  He now has triphasic signals in his radial artery.  He is not reporting any pain or disability at this time.  Duplex ultrasound on today's visit demonstrates widely patent radial artery.  Patient will continue his Eliquis I have put in for a refill.  Patient will follow up in 6 months with a duplex ultrasound.  - VAS [place] UPPER EXTREMITY ARTERIAL DUPLEX; Future  2. Primary hypertension Continue antihypertensive medications as already ordered, these medications have been reviewed and there are no changes at this time.     Levora Dredge, MD  11/09/2020 8:43 AM

## 2020-11-10 ENCOUNTER — Telehealth: Payer: Self-pay | Admitting: Cardiology

## 2020-11-10 NOTE — Telephone Encounter (Signed)
Pt c/o medication issue:  1. Name of Medication: atorvastatin   2. How are you currently taking this medication (dosage and times per day)?   3. Are you having a reaction (difficulty breathing--STAT)? Nerve pain in neck and back    4. What is your medication issue? Patient daughter calling to discuss issues with new med    Prefers a Nurse, learning disability

## 2020-11-16 MED ORDER — PRAVASTATIN SODIUM 20 MG PO TABS: 20.0000 mg | ORAL_TABLET | Freq: Every evening | ORAL | 5 refills | Status: DC

## 2020-11-16 NOTE — Telephone Encounter (Signed)
Called patient using interpreter [phone] and gave her the below recommendation from Dr. Azucena Cecil. Patient verbalized understanding and agreed with plan.  Have patient stop Lipitor, try Pravachol 20 mg daily instead. Monitor for symptoms. Thank you

## 2020-12-21 ENCOUNTER — Encounter: Payer: Self-pay | Admitting: Podiatry

## 2020-12-21 ENCOUNTER — Ambulatory Visit: Payer: 59 | Admitting: Podiatry

## 2020-12-21 ENCOUNTER — Other Ambulatory Visit: Payer: Self-pay

## 2020-12-21 DIAGNOSIS — Z79899 Other long term (current) drug therapy: Secondary | ICD-10-CM | POA: Diagnosis not present

## 2020-12-21 DIAGNOSIS — B351 Tinea unguium: Secondary | ICD-10-CM | POA: Diagnosis not present

## 2020-12-21 NOTE — Progress Notes (Signed)
Subjective:  Patient ID: Jesse Chapman, male    DOB: [birth year],  MRN: [id number]  Chief Complaint  Patient presents with   Nail Problem   Foot Pain    Patient presents today for nail fungus right great toenail and 5th toe and pain in right forefoot x 2 weeks.    55 y.o. male presents with the above complaint.  Patient presents with complaint of right hallux and fifth thickened elongated dystrophic mycotic toenails x2.  Patient states is not painful but wanted to get it treated.  It started bothering him.  He is here with a Spanish interpreter today.  He denies any other acute complaints.  He has tried some over-the-counter medication none of which has helped.  He is on blood thinner secondary to history of blood clots.  He denies any other acute complaints.   Review of Systems: Negative except as noted in the HPI. Denies N/V/F/Ch.  Past Medical History:  Diagnosis Date   Clotting disorder (HCC)    Hyperlipidemia    Hypertension     Current Outpatient Medications:    amLODipine (NORVASC) 5 MG tablet, Take 1 tablet (5 mg total) by mouth daily., Disp: 30 tablet, Rfl: 11   apixaban (ELIQUIS) 5 MG TABS tablet, Take 1 tablet (5 mg total) by mouth 2 (two) times daily., Disp: 60 tablet, Rfl: 11   pravastatin (PRAVACHOL) 20 MG tablet, Take 1 tablet (20 mg total) by mouth every evening., Disp: 30 tablet, Rfl: 5   valACYclovir (VALTREX) 1000 MG tablet, Take 1,000 mg by mouth 3 (three) times daily., Disp: , Rfl:   Social History   Tobacco Use  Smoking Status Former   Pack years: 0.00  Smokeless Tobacco Never    No Known Allergies Objective:  There were no vitals filed for this visit. There is no height or weight on file to calculate BMI. Constitutional Well developed. Well nourished.  Vascular Dorsalis pedis pulses palpable bilaterally. Posterior tibial pulses palpable bilaterally. Capillary refill normal to all digits.  No cyanosis or clubbing noted. Pedal hair growth  normal.  Neurologic Normal speech. Oriented to person, place, and time. Epicritic sensation to light touch grossly present bilaterally.  Dermatologic Thickened elongated dystrophic toenails x2.  Right hallux and right fifth digit.  Mild pain on palpation.  Mycotic nature to it  Orthopedic: Normal joint ROM without pain or crepitus bilaterally. No visible deformities. No bony tenderness.   Radiographs: None Assessment:   1. Encounter for long-term (current) use of medications   2. Onychomycosis due to dermatophyte   3. Nail fungus    Plan:  Patient was evaluated and treated and all questions answered.  Right hallux onychomycosis and fifth digit onychomycosis -Educated the patient on the etiology of onychomycosis and various treatment options associated with improving the fungal load.  I explained to the patient that there is 3 treatment options available to treat the onychomycosis including topical, p.o., laser treatment.  Patient elected to undergo p.o. options with Lamisil/terbinafine therapy.  In order for me to start the medication therapy, I explained to the patient the importance of evaluating the liver and obtaining the liver function test.  Once the liver function test comes back normal I will start him on 68-month course of Lamisil therapy.  Patient understood all risk and would like to proceed with Lamisil therapy.  I have asked the patient to immediately stop the Lamisil therapy if she has any reactions to it and call the office or go to the  emergency room right away.  Patient states understanding   No follow-ups on file.

## 2021-01-17 LAB — HEPATIC FUNCTION PANEL
ALT: 34 IU/L (ref 0–44)
AST: 25 IU/L (ref 0–40)
Albumin: 4.7 g/dL (ref 3.8–4.9)
Alkaline Phosphatase: 81 IU/L (ref 44–121)
Bilirubin Total: 0.6 mg/dL (ref 0.0–1.2)
Bilirubin, Direct: 0.15 mg/dL (ref 0.00–0.40)
Total Protein: 6.9 g/dL (ref 6.0–8.5)

## 2021-01-17 MED ORDER — TERBINAFINE HCL 250 MG PO TABS: 250.0000 mg | ORAL_TABLET | Freq: Every day | ORAL | 0 refills | Status: DC

## 2021-01-17 NOTE — Addendum Note (Signed)
Addended by: Nicholes Rough on: 01/17/2021 07:50 AM   Modules accepted: Orders

## 2021-02-01 ENCOUNTER — Encounter: Payer: Self-pay | Admitting: General Surgery

## 2021-02-02 ENCOUNTER — Encounter: Payer: Self-pay | Admitting: Podiatry

## 2021-02-02 ENCOUNTER — Ambulatory Visit: Payer: 59 | Admitting: Registered Nurse

## 2021-02-02 ENCOUNTER — Other Ambulatory Visit: Payer: Self-pay

## 2021-02-02 ENCOUNTER — Encounter
Admission: RE | Disposition: A | Discharge status: Home / Self Care | Payer: Self-pay | Source: Home / Self Care | Attending: General Surgery

## 2021-02-02 ENCOUNTER — Encounter: Payer: Self-pay | Admitting: General Surgery

## 2021-02-02 ENCOUNTER — Ambulatory Visit
Admission: RE | Admit: 2021-02-02 | Discharge: 2021-02-02 | Disposition: A | Discharge status: Home / Self Care | Payer: 59 | Attending: General Surgery | Admitting: General Surgery

## 2021-02-02 DIAGNOSIS — Z79899 Other long term (current) drug therapy: Secondary | ICD-10-CM | POA: Insufficient documentation

## 2021-02-02 DIAGNOSIS — Z86718 Personal history of other venous thrombosis and embolism: Secondary | ICD-10-CM | POA: Insufficient documentation

## 2021-02-02 DIAGNOSIS — Z7901 Long term (current) use of anticoagulants: Secondary | ICD-10-CM | POA: Diagnosis not present

## 2021-02-02 DIAGNOSIS — Z1211 Encounter for screening for malignant neoplasm of colon: Secondary | ICD-10-CM | POA: Insufficient documentation

## 2021-02-02 DIAGNOSIS — Z87891 Personal history of nicotine dependence: Secondary | ICD-10-CM | POA: Insufficient documentation

## 2021-02-02 DIAGNOSIS — K621 Rectal polyp: Secondary | ICD-10-CM | POA: Insufficient documentation

## 2021-02-02 HISTORY — DX: Essential (primary) hypertension: I10

## 2021-02-02 HISTORY — PX: COLONOSCOPY WITH PROPOFOL: SHX5780

## 2021-02-02 SURGERY — COLONOSCOPY WITH PROPOFOL
Anesthesia: Monitor Anesthesia Care

## 2021-02-02 MED ORDER — PROPOFOL 500 MG/50ML IV EMUL
INTRAVENOUS | Status: DC | PRN
  Administered 2021-02-02: 150 ug/kg/min via INTRAVENOUS

## 2021-02-02 MED ORDER — PROPOFOL 500 MG/50ML IV EMUL
INTRAVENOUS | Status: AC
  Filled 2021-02-02: qty 50

## 2021-02-02 MED ORDER — SODIUM CHLORIDE 0.9 % IV SOLN: INTRAVENOUS | Status: DC

## 2021-02-02 MED ORDER — PROPOFOL 10 MG/ML IV BOLUS
INTRAVENOUS | Status: DC | PRN
Start: 2021-02-02 — End: 2021-02-02
  Administered 2021-02-02: 80 mg via INTRAVENOUS

## 2021-02-02 MED ORDER — GLYCOPYRROLATE 0.2 MG/ML IJ SOLN
INTRAMUSCULAR | Status: DC | PRN
  Administered 2021-02-02: .2 mg via INTRAVENOUS

## 2021-02-02 MED ORDER — LIDOCAINE HCL (CARDIAC) PF 100 MG/5ML IV SOSY
PREFILLED_SYRINGE | INTRAVENOUS | Status: DC | PRN
  Administered 2021-02-02: 40 mg via INTRAVENOUS

## 2021-02-02 NOTE — H&P (Signed)
Jesse Chapman [id number] 1966/01/14     HPI:  55 y/o male for screening colonoscopy. Tolerated prep well. Not presently on Eliquis.   Medications Prior to Admission  Medication Sig Dispense Refill Last Dose   amLODipine (NORVASC) 5 MG tablet Take 5 mg by mouth daily.   Past Month   apixaban (ELIQUIS) 5 MG TABS tablet Take 5 mg by mouth 2 (two) times daily.   Past Month   Not on File Past Medical History:  Diagnosis Date   Hypertension    History reviewed. No pertinent surgical history. Social History   Socioeconomic History   Marital status: Single    Spouse name: Not on file   Number of children: Not on file   Years of education: Not on file   Highest education level: Not on file  Occupational History   Not on file  Tobacco Use   Smoking status: Former    Types: Cigarettes   Smokeless tobacco: Never  Vaping Use   Vaping Use: Never used  Substance and Sexual Activity   Alcohol use: Not Currently   Drug use: Not on file   Sexual activity: Not on file  Other Topics Concern   Not on file  Social History Narrative   Not on file   Social Determinants of Health   Financial Resource Strain: Not on file  Food Insecurity: Not on file  Transportation Needs: Not on file  Physical Activity: Not on file  Stress: Not on file  Social Connections: Not on file  Intimate Partner Violence: Not on file   Social History   Social History Narrative   Not on file     ROS: Negative.     PE: HEENT: Negative. Lungs: Clear. Cardio: RR.    Assessment/Plan:  Proceed with planned endoscopy.    Merrily Pew [facility] 02/02/2021

## 2021-02-02 NOTE — Anesthesia Postprocedure Evaluation (Signed)
Anesthesia Post Note  Patient: Jesse Chapman  Procedure(s) Performed: COLONOSCOPY WITH PROPOFOL  Patient location during evaluation: PACU Anesthesia Type: MAC Level of consciousness: awake and alert Pain management: pain level controlled Vital Signs Assessment: post-procedure vital signs reviewed and stable Respiratory status: spontaneous breathing Cardiovascular status: blood pressure returned to baseline Postop Assessment: no headache Anesthetic complications: no   No notable events documented.   Last Vitals:  Vitals:   02/02/21 1121 02/02/21 1128  BP: 119/82 122/83  Pulse: (!) 59 (!) 51  Resp: 18 (!) 23  Temp:    SpO2: 98% 98%    Last Pain:  Vitals:   02/02/21 1010  TempSrc: Temporal  PainSc: 0-No pain                 Milinda Pointer

## 2021-02-02 NOTE — Transfer of Care (Signed)
Immediate Anesthesia Transfer of Care Note  Patient: Jesse Chapman  Procedure(s) Performed: Procedure(s) with comments: COLONOSCOPY WITH PROPOFOL (N/A) Everlene Balls  Patient Location: PACU and Endoscopy Unit  Anesthesia Type:General  Level of Consciousness: sedated  Airway & Oxygen Therapy: Patient Spontanous Breathing and Patient connected to nasal cannula oxygen  Post-op Assessment: Report given to RN and Post -op Vital signs reviewed and stable  Post vital signs: Reviewed and stable  Last Vitals:  Vitals:   02/02/21 1010  BP: (!) 142/94  Pulse: 70  Resp: 16  Temp: (!) 36.3 C  SpO2: 99%    Complications: No apparent anesthesia complications

## 2021-02-02 NOTE — Anesthesia Procedure Notes (Signed)
Date/Time: 02/02/2021 10:32 AM Performed by: Stormy Fabian, CRNA Pre-anesthesia Checklist: Patient identified, Emergency Drugs available, Suction available and Patient being monitored Patient Re-evaluated:Patient Re-evaluated prior to induction Oxygen Delivery Method: Nasal cannula Induction Type: IV induction Dental Injury: Teeth and Oropharynx as per pre-operative assessment  Comments: Nasal cannula with etCO2 monitoring

## 2021-02-02 NOTE — Op Note (Addendum)
[facility] Gastroenterology Patient Name: Jesse Chapman Procedure Date: 02/02/2021 10:00 AM MRN: [id number] Account #: [id number] Date of Birth: 1966/03/30 Admit Type: Outpatient Age: 55 Room: [facility] ENDO ROOM 1 Gender: Male Note Status: Supervisor Override Procedure:             Colonoscopy Indications:           Screening for colorectal malignant neoplasm Providers:             Earline Mayotte, MD Referring MD:          [facility] Medicines:             Propofol per Anesthesia Complications:         No immediate complications. Procedure:             Pre-Anesthesia Assessment:                        - Prior to the procedure, a History and Physical was                         performed, and patient medications, allergies and                         sensitivities were reviewed. The patient's tolerance                         of previous anesthesia was reviewed.                        - The risks and benefits of the procedure and the                         sedation options and risks were discussed with the                         patient. All questions were answered and informed                         consent was obtained.                        After obtaining informed consent, the colonoscope was                         passed under direct vision. Throughout the procedure,                         the patient's blood pressure, pulse, and oxygen                         saturations were monitored continuously. The                         Colonoscope was introduced through the anus and                         advanced to the the cecum, identified by appendiceal                         orifice and ileocecal valve. The  colonoscopy was                         performed without difficulty. The patient tolerated                         the procedure well. The quality of the bowel                         preparation was good. Findings:      A 5 mm polyp was found  in the rectum. The polyp was sessile. Biopsies       were taken with a cold forceps for histology.      The retroflexed view of the distal rectum and anal verge was normal and       showed no anal or rectal abnormalities. Impression:            - One 5 mm polyp in the rectum. Biopsied.                        - The distal rectum and anal verge are normal on                         retroflexion view. Recommendation:        - Telephone endoscopist for pathology results in 1                         week. Procedure Code(s):     --- Professional ---                        [phone], Colonoscopy, flexible; with biopsy, single or                         multiple Diagnosis Code(s):     --- Professional ---                        Z12.11, Encounter for screening for malignant neoplasm                         of colon                        K62.1, Rectal polyp CPT copyright 2019 American Medical Association. All rights reserved. The codes documented in this report are preliminary and upon coder review may  be revised to meet current compliance requirements. Earline Mayotte, MD 02/02/2021 10:55:04 AM This report has been signed electronically. Number of Addenda: 0 Note Initiated On: 02/02/2021 10:00 AM Scope Withdrawal Time: 0 hours 13 minutes 32 seconds  Total Procedure Duration: 0 hours 16 minutes 50 seconds  Estimated Blood Loss:  Estimated blood loss: none.      [facility]

## 2021-02-02 NOTE — Anesthesia Preprocedure Evaluation (Signed)
Anesthesia Evaluation  Patient identified by MRN, date of birth, ID band Patient awake    History of Anesthesia Complications Negative for: history of anesthetic complications  Airway Mallampati: II  TM Distance: >3 FB Neck ROM: Full    Dental no notable dental hx.    Pulmonary neg pulmonary ROS, former smoker,    Pulmonary exam normal - rhonchi (-) wheezing      Cardiovascular hypertension, Normal cardiovascular exam Rhythm:Regular Rate:Normal - Systolic murmurs and - Diastolic murmurs    Neuro/Psych    GI/Hepatic negative GI ROS, Neg liver ROS,   Endo/Other  negative endocrine ROS  Renal/GU negative Renal ROS     Musculoskeletal   Abdominal Normal abdominal exam  (+)   Peds negative pediatric ROS (+)  Hematology History of DVT about 7 months - no cause found - on AC for about 5 months but none for last month   Anesthesia Other Findings   Reproductive/Obstetrics                             Anesthesia Physical Anesthesia Plan  ASA: 2  Anesthesia Plan: MAC   Post-op Pain Management:    Induction: Intravenous  PONV Risk Score and Plan: 1 and Propofol infusion and TIVA  Airway Management Planned: Mask and Natural Airway  Additional Equipment:   Intra-op Plan:   Post-operative Plan:   Informed Consent: I have reviewed the patients History and Physical, chart, labs and discussed the procedure including the risks, benefits and alternatives for the proposed anesthesia with the patient or authorized representative who has indicated his/her understanding and acceptance.     Dental advisory given  Plan Discussed with: CRNA and Anesthesiologist  Anesthesia Plan Comments: (IVGA, PIV x 1, ASA SM, RBO discussed, Consent signed.)        Anesthesia Quick Evaluation

## 2021-02-05 ENCOUNTER — Other Ambulatory Visit (INDEPENDENT_AMBULATORY_CARE_PROVIDER_SITE_OTHER): Payer: Self-pay | Admitting: Vascular Surgery

## 2021-02-05 DIAGNOSIS — I742 Embolism and thrombosis of arteries of the upper extremities: Secondary | ICD-10-CM

## 2021-02-05 LAB — SURGICAL PATHOLOGY

## 2021-02-05 MED ORDER — APIXABAN 5 MG PO TABS: 5.0000 mg | ORAL_TABLET | Freq: Two times a day (BID) | ORAL | 11 refills | Status: DC

## 2021-02-12 ENCOUNTER — Ambulatory Visit: Payer: 59 | Admitting: Cardiology

## 2021-02-13 ENCOUNTER — Encounter: Payer: Self-pay | Admitting: Cardiology

## 2021-04-26 ENCOUNTER — Other Ambulatory Visit: Payer: Self-pay

## 2021-04-26 ENCOUNTER — Ambulatory Visit (INDEPENDENT_AMBULATORY_CARE_PROVIDER_SITE_OTHER): Payer: 59 | Admitting: Podiatry

## 2021-04-26 DIAGNOSIS — Z79899 Other long term (current) drug therapy: Secondary | ICD-10-CM

## 2021-04-26 DIAGNOSIS — B351 Tinea unguium: Secondary | ICD-10-CM | POA: Diagnosis not present

## 2021-04-26 NOTE — Progress Notes (Signed)
Subjective:  Patient ID: Jesse Chapman, male    DOB: 07-15-1965,  MRN: [id number]  Chief Complaint  Patient presents with   Nail Problem    Fungal nail follow up     55 y.o. male presents with the above complaint.  Patient presents with follow-up from complaint of right hallux and fifth thickened elongated dystrophic mycotic toenails x2.  Patient states that it is improved considerably.  He is completed 1 round of Lamisil therapy so far.  He states that it has improved considerably he would like to know what the next options are.  He is tolerated the medication really well.   Review of Systems: Negative except as noted in the HPI. Denies N/V/F/Ch.  Past Medical History:  Diagnosis Date   Clotting disorder (HCC)    Hyperlipidemia    Hypertension     Current Outpatient Medications:    amLODipine (NORVASC) 5 MG tablet, Take 1 tablet (5 mg total) by mouth daily., Disp: 30 tablet, Rfl: 11   amLODipine (NORVASC) 5 MG tablet, Take 5 mg by mouth daily., Disp: , Rfl:    apixaban (ELIQUIS) 5 MG TABS tablet, Take 1 tablet (5 mg total) by mouth 2 (two) times daily., Disp: 60 tablet, Rfl: 11   apixaban (ELIQUIS) 5 MG TABS tablet, Take 5 mg by mouth 2 (two) times daily., Disp: , Rfl:    pravastatin (PRAVACHOL) 20 MG tablet, Take 1 tablet (20 mg total) by mouth every evening., Disp: 30 tablet, Rfl: 5   terbinafine (LAMISIL) 250 MG tablet, Take 1 tablet (250 mg total) by mouth daily., Disp: 90 tablet, Rfl: 0   valACYclovir (VALTREX) 1000 MG tablet, Take 1,000 mg by mouth 3 (three) times daily., Disp: , Rfl:   Social History   Tobacco Use  Smoking Status Former   Types: Cigarettes  Smokeless Tobacco Never    No Known Allergies Objective:  There were no vitals filed for this visit. There is no height or weight on file to calculate BMI. Constitutional Well developed. Well nourished.  Vascular Dorsalis pedis pulses palpable bilaterally. Posterior tibial pulses palpable  bilaterally. Capillary refill normal to all digits.  No cyanosis or clubbing noted. Pedal hair growth normal.  Neurologic Normal speech. Oriented to person, place, and time. Epicritic sensation to light touch grossly present bilaterally.  Dermatologic Improving thickened elongated dystrophic toenails x2.  Right hallux and right fifth digit.  Mild pain on palpation.  Mycotic nature to it  Orthopedic: Normal joint ROM without pain or crepitus bilaterally. No visible deformities. No bony tenderness.   Radiographs: None Assessment:   1. Encounter for long-term (current) use of medications   2. Onychomycosis due to dermatophyte   3. Nail fungus    Plan:  Patient was evaluated and treated and all questions answered.  Right hallux onychomycosis and fifth digit onychomycosis~second round -Educated the patient on the etiology of onychomycosis and various treatment options associated with improving the fungal load.  I explained to the patient that there is 3 treatment options available to treat the onychomycosis including topical, p.o., laser treatment.  Patient elected to undergo second round of p.o. options with Lamisil/terbinafine therapy.  In order for me to start the medication therapy, I explained to the patient the importance of evaluating the liver and obtaining second the liver function test.  Once the liver function test comes back normal I will start him again on 43-month course of Lamisil therapy.  Patient understood all risk and would like to proceed with  Lamisil therapy.  I have asked the patient to immediately stop the Lamisil therapy if she has any reactions to it and call the office or go to the emergency room right away.  Patient states understanding   No follow-ups on file.

## 2021-04-27 ENCOUNTER — Other Ambulatory Visit: Payer: Self-pay | Admitting: Podiatry

## 2021-04-27 LAB — HEPATIC FUNCTION PANEL
ALT: 35 IU/L (ref 0–44)
AST: 24 IU/L (ref 0–40)
Albumin: 4.7 g/dL (ref 3.8–4.9)
Alkaline Phosphatase: 78 IU/L (ref 44–121)
Bilirubin Total: 0.4 mg/dL (ref 0.0–1.2)
Bilirubin, Direct: 0.12 mg/dL (ref 0.00–0.40)
Total Protein: 6.8 g/dL (ref 6.0–8.5)

## 2021-04-27 MED ORDER — TERBINAFINE HCL 250 MG PO TABS: 250.0000 mg | ORAL_TABLET | Freq: Every day | ORAL | 0 refills | Status: DC

## 2021-04-30 ENCOUNTER — Other Ambulatory Visit: Payer: Self-pay | Admitting: Podiatry

## 2021-04-30 NOTE — Telephone Encounter (Signed)
Please advise 

## 2021-05-08 ENCOUNTER — Other Ambulatory Visit: Payer: Self-pay | Admitting: Podiatry

## 2021-05-08 ENCOUNTER — Telehealth: Payer: Self-pay | Admitting: Podiatry

## 2021-05-08 MED ORDER — TERBINAFINE HCL 250 MG PO TABS: 250.0000 mg | ORAL_TABLET | Freq: Every day | ORAL | 0 refills | Status: DC

## 2021-05-08 NOTE — Telephone Encounter (Signed)
Patient called stating his RX was not at the pharmacy. Pt pharmacy is [facility].

## 2021-05-10 ENCOUNTER — Other Ambulatory Visit (INDEPENDENT_AMBULATORY_CARE_PROVIDER_SITE_OTHER): Payer: 59

## 2021-05-10 ENCOUNTER — Ambulatory Visit (INDEPENDENT_AMBULATORY_CARE_PROVIDER_SITE_OTHER): Payer: 59 | Admitting: Vascular Surgery

## 2021-05-30 ENCOUNTER — Other Ambulatory Visit (INDEPENDENT_AMBULATORY_CARE_PROVIDER_SITE_OTHER): Payer: Self-pay | Admitting: Vascular Surgery

## 2021-05-30 DIAGNOSIS — Z9862 Peripheral vascular angioplasty status: Secondary | ICD-10-CM

## 2021-06-04 ENCOUNTER — Ambulatory Visit (INDEPENDENT_AMBULATORY_CARE_PROVIDER_SITE_OTHER): Payer: 59

## 2021-06-04 ENCOUNTER — Encounter (INDEPENDENT_AMBULATORY_CARE_PROVIDER_SITE_OTHER): Payer: Self-pay | Admitting: Vascular Surgery

## 2021-06-04 ENCOUNTER — Ambulatory Visit (INDEPENDENT_AMBULATORY_CARE_PROVIDER_SITE_OTHER): Payer: 59 | Admitting: Vascular Surgery

## 2021-06-04 ENCOUNTER — Other Ambulatory Visit: Payer: Self-pay

## 2021-06-04 VITALS — BP 150/91 | HR 75 | Ht 65.0 in | Wt 196.0 lb

## 2021-06-04 DIAGNOSIS — E782 Mixed hyperlipidemia: Secondary | ICD-10-CM

## 2021-06-04 DIAGNOSIS — Z9862 Peripheral vascular angioplasty status: Secondary | ICD-10-CM

## 2021-06-04 DIAGNOSIS — I742 Embolism and thrombosis of arteries of the upper extremities: Secondary | ICD-10-CM

## 2021-06-04 MED ORDER — APIXABAN 5 MG PO TABS: 5.0000 mg | ORAL_TABLET | Freq: Two times a day (BID) | ORAL | 5 refills | Status: DC

## 2021-06-04 NOTE — Progress Notes (Signed)
MRN : [id number]  Jesse Chapman is a 55 y.o. (September 03, 1965) male who presents with chief complaint of check right arm.  History of Present Illness:   The patient returns to the office for followup and review status post angiogram with intervention on 07/25/2020.    Procedure: Mechanical thrombectomy right radial artery,  mechanical thrombectomy right ulnar artery with percutaneous transluminal angioplasty right ulnar artery to 2 mm   The patient notes improvement in the right upper extremity symptoms. No interval claudication symptoms or rest pain symptoms.  No new ulcers or wounds have occurred since the last visit.   There have been no significant changes to the patient's overall health care.  He notes occasionally a sharp pain in the forearm or in the triceps area and sometimes a pain across his chest.  It seems to be related to weight lifting but he was concerned because he is lifting significantly less weight now at the request of one of his other doctors.   The patient denies amaurosis fugax or recent TIA symptoms. There are no recent neurological changes noted. The patient denies history of DVT, PE or superficial thrombophlebitis. The patient denies recent episodes of angina or shortness of breath.    Duplex US of the right upper extremity arterial system shows widely patent radial artery with triphasic signals  Ulnar artery is patent and now has triphasic signals.  Overall his ultrasound is improved compared to last study.    Current Meds  Medication Sig   amLODipine (NORVASC) 5 MG tablet Take 1 tablet (5 mg total) by mouth daily.   amLODipine (NORVASC) 5 MG tablet Take 5 mg by mouth daily.   apixaban (ELIQUIS) 5 MG TABS tablet Take 1 tablet (5 mg total) by mouth 2 (two) times daily.   apixaban (ELIQUIS) 5 MG TABS tablet Take 5 mg by mouth 2 (two) times daily.   terbinafine (LAMISIL) 250 MG tablet Take 1 tablet (250 mg total) by mouth daily.   terbinafine (LAMISIL) 250  MG tablet Take 1 tablet (250 mg total) by mouth daily.   terbinafine (LAMISIL) 250 MG tablet Take 1 tablet (250 mg total) by mouth daily.   valACYclovir (VALTREX) 1000 MG tablet Take 1,000 mg by mouth 3 (three) times daily.    Past Medical History:  Diagnosis Date   Clotting disorder (HCC)    Hyperlipidemia    Hypertension     Past Surgical History:  Procedure Laterality Date   COLONOSCOPY WITH PROPOFOL N/A 02/02/2021   Procedure: COLONOSCOPY WITH PROPOFOL;  Surgeon: Earline Mayotte, MD;  Location: [facility] ENDOSCOPY;  Service: Gastroenterology;  Laterality: N/A;  ELIQUIS   GALLBLADDER SURGERY     UPPER EXTREMITY ANGIOGRAPHY Right 07/25/2020   Procedure: Upper Extremity Angiography;  Surgeon: Renford Dills, MD;  Location: [facility] INVASIVE CV LAB;  Service: Cardiovascular;  Laterality: Right;    Social History Social History   Tobacco Use   Smoking status: Former    Types: Cigarettes   Smokeless tobacco: Never  Vaping Use   Vaping Use: Never used  Substance Use Topics   Alcohol use: Not Currently   Drug use: Never    Family History Family History  Problem Relation Age of Onset   Healthy Mother    Healthy Father     Allergies  Allergen Reactions   Statins Other (See Comments)   Pravastatin Sodium     Note: pain in side, causing issues with breathing     REVIEW OF SYSTEMS (  Negative unless checked)  Constitutional: [] Weight loss  [] Fever  [] Chills Cardiac: [] Chest pain   [] Chest pressure   [] Palpitations   [] Shortness of breath when laying flat   [] Shortness of breath with exertion. Vascular:  [] Pain in legs with walking   [] Pain in legs at rest  [] History of DVT   [] Phlebitis   [] Swelling in legs   [] Varicose veins   [] Non-healing ulcers Pulmonary:   [] Uses home oxygen   [] Productive cough   [] Hemoptysis   [] Wheeze  [] COPD   [] Asthma Neurologic:  [] Dizziness   [] Seizures   [] History of stroke   [] History of TIA  [] Aphasia   [] Vissual changes   [] Weakness or  numbness in arm   [] Weakness or numbness in leg Musculoskeletal:   [] Joint swelling   [] Joint pain   [] Low back pain Hematologic:  [] Easy bruising  [] Easy bleeding   [] Hypercoagulable state   [] Anemic Gastrointestinal:  [] Diarrhea   [] Vomiting  [] Gastroesophageal reflux/heartburn   [] Difficulty swallowing. Genitourinary:  [] Chronic kidney disease   [] Difficult urination  [] Frequent urination   [] Blood in urine Skin:  [] Rashes   [] Ulcers  Psychological:  [] History of anxiety   []  History of major depression.  Physical Examination  Vitals:   06/04/21 0826  BP: (!) 150/91  Pulse: 75  Weight: 196 lb (88.9 kg)  Height: 5\' 5"  (1.651 m)   Body mass index is 32.62 kg/m. Gen: WD/WN, NAD Head: [place]/AT, No temporalis wasting.  Ear/Nose/Throat: Hearing grossly intact, nares w/o erythema or drainage Eyes: PER, EOMI, sclera nonicteric.  Neck: Supple, no masses.  No bruit or JVD.  Pulmonary:  Good air movement, no audible wheezing, no use of accessory muscles.  Cardiac: RRR, normal S1, S2, no Murmurs. Vascular:   Vessel Right Left  Radial Palpable Palpable  Gastrointestinal: soft, non-distended. No guarding/no peritoneal signs.  Musculoskeletal: M/S 5/5 throughout.  No visible deformity.  Neurologic: CN 2-12 intact. Pain and light touch intact in extremities.  Symmetrical.  Speech is fluent. Motor exam as listed above. Psychiatric: Judgment intact, Mood & affect appropriate for pt's clinical situation. Dermatologic: No rashes or ulcers noted.  No changes consistent with cellulitis.   CBC Lab Results  Component Value Date   WBC 7.8 07/28/2020   HGB 11.9 (L) 07/28/2020   HCT 33.0 (L) 07/28/2020   MCV 85.7 07/28/2020   PLT 277 07/28/2020    BMET    Component Value Date/Time   NA 138 07/28/2020 0356   K 3.6 07/28/2020 0356   CL 106 07/28/2020 0356   CO2 23 07/28/2020 0356   GLUCOSE 127 (H) 07/28/2020 0356   BUN 13 07/28/2020 0356   CREATININE 0.77 07/28/2020 0356   CALCIUM 8.3 (L)  07/28/2020 0356   GFRNONAA >60 07/28/2020 0356   GFRAA >60 08/31/2019 1840   CrCl cannot be calculated (Patient's most recent lab result is older than the maximum 21 days allowed.).  COAG No results found for: INR, PROTIME  Radiology No results found.   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  I do not believe the intermittent pains he is describing are related to his arterial embolization or thrombosis.  He has no obvious residual deficit and the chest complaints would certainly fit more with a musculoskeletal process.  He will continue his Eliquis which I have reordered.  I will see him back after January at which point we can explore  the possibility of decreasing his blood thinner to 2.5 mg twice daily.  2. Mixed hyperlipidemia Continue statin as ordered and reviewed, no changes at this time    Levora Dredge, MD  06/04/2021 8:47 AM

## 2021-08-28 ENCOUNTER — Ambulatory Visit (INDEPENDENT_AMBULATORY_CARE_PROVIDER_SITE_OTHER): Payer: 59 | Admitting: Podiatry

## 2021-08-28 ENCOUNTER — Other Ambulatory Visit: Payer: Self-pay

## 2021-08-28 DIAGNOSIS — B353 Tinea pedis: Secondary | ICD-10-CM

## 2021-08-28 DIAGNOSIS — B351 Tinea unguium: Secondary | ICD-10-CM | POA: Diagnosis not present

## 2021-08-28 DIAGNOSIS — Z79899 Other long term (current) drug therapy: Secondary | ICD-10-CM

## 2021-08-28 MED ORDER — CLOTRIMAZOLE-BETAMETHASONE 1-0.05 % EX CREA: 1.0000 "application " | TOPICAL_CREAM | Freq: Two times a day (BID) | CUTANEOUS | 0 refills | Status: DC

## 2021-08-30 NOTE — Progress Notes (Signed)
Subjective:  Patient ID: Jesse Chapman, male    DOB: Dec 11, 1965,  MRN: [id number]  Chief Complaint  Patient presents with   Nail Problem    Nail fungus follow up     56 y.o. male presents with the above complaint.  Patient presents with a follow-up of thickened elongated dystrophic toenails x2 to right hallux and right fifth digit.  He states he is doing a lot better and has completely resolved.  He also would like to discuss treatment for athlete's foot.  The pill definitely helped he would like to know if there is any topical medication he can provide the same time.   Review of Systems: Negative except as noted in the HPI. Denies N/V/F/Ch.  Past Medical History:  Diagnosis Date   Clotting disorder (HCC)    Hyperlipidemia    Hypertension     Current Outpatient Medications:    clotrimazole-betamethasone (LOTRISONE) cream, Apply 1 application topically 2 (two) times daily., Disp: 30 g, Rfl: 0   amLODipine (NORVASC) 5 MG tablet, Take 1 tablet (5 mg total) by mouth daily., Disp: 30 tablet, Rfl: 11   amLODipine (NORVASC) 5 MG tablet, Take 5 mg by mouth daily., Disp: , Rfl:    apixaban (ELIQUIS) 5 MG TABS tablet, Take 1 tablet (5 mg total) by mouth 2 (two) times daily., Disp: 60 tablet, Rfl: 5   pravastatin (PRAVACHOL) 20 MG tablet, Take 1 tablet (20 mg total) by mouth every evening., Disp: 30 tablet, Rfl: 5   terbinafine (LAMISIL) 250 MG tablet, Take 1 tablet (250 mg total) by mouth daily., Disp: 90 tablet, Rfl: 0   terbinafine (LAMISIL) 250 MG tablet, Take 1 tablet (250 mg total) by mouth daily., Disp: 90 tablet, Rfl: 0   terbinafine (LAMISIL) 250 MG tablet, Take 1 tablet (250 mg total) by mouth daily., Disp: 90 tablet, Rfl: 0   valACYclovir (VALTREX) 1000 MG tablet, Take 1,000 mg by mouth 3 (three) times daily., Disp: , Rfl:   Social History   Tobacco Use  Smoking Status Former   Types: Cigarettes  Smokeless Tobacco Never    Allergies  Allergen Reactions   Statins  Other (See Comments)   Pravastatin Sodium     Note: pain in side, causing issues with breathing   Objective:  There were no vitals filed for this visit. There is no height or weight on file to calculate BMI. Constitutional Well developed. Well nourished.  Vascular Dorsalis pedis pulses palpable bilaterally. Posterior tibial pulses palpable bilaterally. Capillary refill normal to all digits.  No cyanosis or clubbing noted. Pedal hair growth normal.  Neurologic Normal speech. Oriented to person, place, and time. Epicritic sensation to light touch grossly present bilaterally.  Dermatologic No further hickened elongated dystrophic toenails x2.  Right hallux and right fifth digit.  Mild pain on palpation.  Mycotic nature to it  Mild epidermolysis noted to the right lateral his foot.  There is some breakdown of the skin noted.  No ulcers noted no wounds noted.  Orthopedic: Normal joint ROM without pain or crepitus bilaterally. No visible deformities. No bony tenderness.   Radiographs: None Assessment:   1. Onychomycosis due to dermatophyte   2. Nail fungus   3. Encounter for long-term (current) use of medications   4. Athlete's foot, right     Plan:  Patient was evaluated and treated and all questions answered.  Right hallux onychomycosis and fifth digit onychomycosis~second round -Clinically resolved with 2 round of Lamisil.  I encouraged patient to  continue monitoring the nails.  If it starts coming back to come see me right away.  He states understanding  Right foot athlete's foot -I explained to the patient the etiology of athlete's foot emergency room and options were discussed.  He will benefit from Lotrisone cream.  Lotrisone cream was sent to the pharmacy.  I have asked him to apply twice a day.  No follow-ups on file.

## 2021-09-05 NOTE — Progress Notes (Deleted)
? ? ?MRN : [id number] ? ?Jesse Chapman is a 56 y.o. ([birth year]) male who presents with chief complaint of check right arm. ? ?History of Present Illness:  ? ?The patient returns to the office for followup and review status post angiogram with intervention on 07/25/2020.  ?  ?Procedure: ?Mechanical thrombectomy right radial artery,  mechanical thrombectomy right ulnar artery with percutaneous transluminal angioplasty right ulnar artery to 2 mm ?  ?The patient notes improvement in the right upper extremity symptoms. No interval claudication symptoms or rest pain symptoms.  No new ulcers or wounds have occurred since the last visit. ?  ?There have been no significant changes to the patient's overall health care.  He notes occasionally a sharp pain in the forearm or in the triceps area and sometimes a pain across his chest.  It seems to be related to weight lifting but he was concerned because he is lifting significantly less weight now at the request of one of his other doctors. ?  ?The patient denies amaurosis fugax or recent TIA symptoms. There are no recent neurological changes noted. ?The patient denies history of DVT, PE or superficial thrombophlebitis. ?The patient denies recent episodes of angina or shortness of breath.  ?  ?Duplex US of the right upper extremity arterial system shows widely patent radial artery with triphasic signals  Ulnar artery is patent and now has triphasic signals.  Overall his ultrasound is improved compared to last study. ?  ? ?No outpatient medications have been marked as taking for the 09/06/21 encounter (Appointment) with Gilda Crease, Latina Craver, MD.  ? ? ?Past Medical History:  ?Diagnosis Date  ? Clotting disorder (HCC)   ? Hyperlipidemia   ? Hypertension   ? ? ?Past Surgical History:  ?Procedure Laterality Date  ? COLONOSCOPY WITH PROPOFOL N/A 02/02/2021  ? Procedure: COLONOSCOPY WITH PROPOFOL;  Surgeon: Earline Mayotte, MD;  Location: [facility] ENDOSCOPY;  Service: Gastroenterology;   Laterality: N/A;  ELIQUIS  ? GALLBLADDER SURGERY    ? UPPER EXTREMITY ANGIOGRAPHY Right 07/25/2020  ? Procedure: Upper Extremity Angiography;  Surgeon: Renford Dills, MD;  Location: [facility] INVASIVE CV LAB;  Service: Cardiovascular;  Laterality: Right;  ? ? ?Social History ?Social History  ? ?Tobacco Use  ? Smoking status: Former  ?  Types: Cigarettes  ? Smokeless tobacco: Never  ?Vaping Use  ? Vaping Use: Never used  ?Substance Use Topics  ? Alcohol use: Not Currently  ? Drug use: Never  ? ? ?Family History ?Family History  ?Problem Relation Age of Onset  ? Healthy Mother   ? Healthy Father   ? ? ?Allergies  ?Allergen Reactions  ? Statins Other (See Comments)  ? Pravastatin Sodium   ?  Note: pain in side, causing issues with breathing  ? ? ? ?REVIEW OF SYSTEMS (Negative unless checked) ? ?Constitutional: [] Weight loss  [] Fever  [] Chills ?Cardiac: [] Chest pain   [] Chest pressure   [] Palpitations   [] Shortness of breath when laying flat   [] Shortness of breath with exertion. ?Vascular:  [] Pain in legs with walking   [] Pain in legs at rest  [] History of DVT   [] Phlebitis   [] Swelling in legs   [] Varicose veins   [] Non-healing ulcers ?Pulmonary:   [] Uses home oxygen   [] Productive cough   [] Hemoptysis   [] Wheeze  [] COPD   [] Asthma ?Neurologic:  [] Dizziness   [] Seizures   [] History of stroke   [] History of TIA  [] Aphasia   [] Vissual changes   [] Weakness or  numbness in arm   [] Weakness or numbness in leg ?Musculoskeletal:   [] Joint swelling   [] Joint pain   [] Low back pain ?Hematologic:  [] Easy bruising  [] Easy bleeding   [] Hypercoagulable state   [] Anemic ?Gastrointestinal:  [] Diarrhea   [] Vomiting  [] Gastroesophageal reflux/heartburn   [] Difficulty swallowing. ?Genitourinary:  [] Chronic kidney disease   [] Difficult urination  [] Frequent urination   [] Blood in urine ?Skin:  [] Rashes   [] Ulcers  ?Psychological:  [] History of anxiety   []  History of major depression. ? ?Physical Examination ? ?There were no vitals filed  for this visit. ?There is no height or weight on file to calculate BMI. ?Gen: WD/WN, NAD ?Head: [place]/AT, No temporalis wasting.  ?Ear/Nose/Throat: Hearing grossly intact, nares w/o erythema or drainage ?Eyes: PER, EOMI, sclera nonicteric.  ?Neck: Supple, no masses.  No bruit or JVD.  ?Pulmonary:  Good air movement, no audible wheezing, no use of accessory muscles.  ?Cardiac: RRR, normal S1, S2, no Murmurs. ?Vascular:  *** ?Vessel Right Left  ?Radial Palpable Palpable  ?Carotid Palpable Palpable  ?PT Palpable Palpable  ?DP Palpable Palpable  ?Gastrointestinal: soft, non-distended. No guarding/no peritoneal signs.  ?Musculoskeletal: M/S 5/5 throughout.  No visible deformity.  ?Neurologic: CN 2-12 intact. Pain and light touch intact in extremities.  Symmetrical.  Speech is fluent. Motor exam as listed above. ?Psychiatric: Judgment intact, Mood & affect appropriate for pt's clinical situation. ?Dermatologic: No rashes or ulcers noted.  No changes consistent with cellulitis. ? ? ?CBC ?Lab Results  ?Component Value Date  ? WBC 7.8 07/28/2020  ? HGB 11.9 (L) 07/28/2020  ? HCT 33.0 (L) 07/28/2020  ? MCV 85.7 07/28/2020  ? PLT 277 07/28/2020  ? ? ?BMET ?   ?Component Value Date/Time  ? NA 138 07/28/2020 0356  ? K 3.6 07/28/2020 0356  ? CL 106 07/28/2020 0356  ? CO2 23 07/28/2020 0356  ? GLUCOSE 127 (H) 07/28/2020 0356  ? BUN 13 07/28/2020 0356  ? CREATININE 0.77 07/28/2020 0356  ? CALCIUM 8.3 (L) 07/28/2020 0356  ? GFRNONAA >60 07/28/2020 0356  ? GFRAA >60 08/31/2019 1840  ? ?CrCl cannot be calculated (Patient's most recent lab result is older than the maximum 21 days allowed.). ? ?COAG ?No results found for: INR, PROTIME ? ?Radiology ?No results found. ? ? ?Assessment/Plan ?There are no diagnoses linked to this encounter. ? ? ? , MD ? ?09/05/2021 ?2:18 PM ? ?  ?

## 2021-09-06 ENCOUNTER — Encounter (INDEPENDENT_AMBULATORY_CARE_PROVIDER_SITE_OTHER): Payer: Self-pay

## 2021-09-06 ENCOUNTER — Ambulatory Visit (INDEPENDENT_AMBULATORY_CARE_PROVIDER_SITE_OTHER): Payer: 59 | Admitting: Vascular Surgery

## 2021-09-06 DIAGNOSIS — E782 Mixed hyperlipidemia: Secondary | ICD-10-CM

## 2021-09-06 DIAGNOSIS — I998 Other disorder of circulatory system: Secondary | ICD-10-CM

## 2021-11-27 ENCOUNTER — Other Ambulatory Visit (INDEPENDENT_AMBULATORY_CARE_PROVIDER_SITE_OTHER): Payer: Self-pay | Admitting: Vascular Surgery

## 2021-11-27 ENCOUNTER — Other Ambulatory Visit: Payer: Self-pay | Admitting: *Deleted

## 2021-11-29 MED ORDER — PRAVASTATIN SODIUM 20 MG PO TABS: 20.0000 mg | ORAL_TABLET | Freq: Every evening | ORAL | 0 refills | Status: DC

## 2021-11-29 NOTE — Telephone Encounter (Signed)
Called with interpreter  Phone number not in service

## 2022-02-27 NOTE — Progress Notes (Signed)
MRN : [id number]  Jesse Chapman is a 56 y.o. (26-Oct-1965) male who presents with chief complaint of check circulation.  History of Present Illness:   The patient returns to the office for followup and review status post angiogram with intervention on 07/25/2020.    Procedure: Mechanical thrombectomy right radial artery,  mechanical thrombectomy right ulnar artery with percutaneous transluminal angioplasty right ulnar artery to 2 mm   The patient notes improvement in the right upper extremity symptoms. No interval claudication symptoms or rest pain symptoms.  No new ulcers or wounds have occurred since the last visit.   There have been no significant changes to the patient's overall health care.  He notes occasionally a sharp pain in the forearm or in the triceps area and sometimes a pain across his chest.  This is similar to his last visit.     The patient denies amaurosis fugax or recent TIA symptoms. There are no recent neurological changes noted. The patient denies history of DVT, PE or superficial thrombophlebitis. The patient denies recent episodes of angina or shortness of breath.    Duplex US of the right upper extremity arterial system shows widely patent radial artery with triphasic signals  Ulnar artery is patent and now has triphasic signals.  Overall his ultrasound is improved compared to last study.  No outpatient medications have been marked as taking for the 02/28/22 encounter (Appointment) with Gilda Crease, Latina Craver, MD.    Past Medical History:  Diagnosis Date   Clotting disorder [facility])    Hyperlipidemia    Hypertension     Past Surgical History:  Procedure Laterality Date   COLONOSCOPY WITH PROPOFOL N/A 02/02/2021   Procedure: COLONOSCOPY WITH PROPOFOL;  Surgeon: Earline Mayotte, MD;  Location: [facility] ENDOSCOPY;  Service: Gastroenterology;  Laterality: N/A;  ELIQUIS   GALLBLADDER SURGERY     UPPER EXTREMITY ANGIOGRAPHY Right 07/25/2020   Procedure:  Upper Extremity Angiography;  Surgeon: Renford Dills, MD;  Location: [facility] INVASIVE CV LAB;  Service: Cardiovascular;  Laterality: Right;    Social History Social History   Tobacco Use   Smoking status: Former    Types: Cigarettes   Smokeless tobacco: Never  Vaping Use   Vaping Use: Never used  Substance Use Topics   Alcohol use: Not Currently   Drug use: Never    Family History Family History  Problem Relation Age of Onset   Healthy Mother    Healthy Father     Allergies  Allergen Reactions   Statins Other (See Comments)   Pravastatin Sodium     Note: pain in side, causing issues with breathing     REVIEW OF SYSTEMS (Negative unless checked)  Constitutional: [] Weight loss  [] Fever  [] Chills Cardiac: [] Chest pain   [] Chest pressure   [] Palpitations   [] Shortness of breath when laying flat   [] Shortness of breath with exertion. Vascular:  [x] Pain in legs with walking   [] Pain in legs at rest  [] History of DVT   [] Phlebitis   [] Swelling in legs   [] Varicose veins   [] Non-healing ulcers Pulmonary:   [] Uses home oxygen   [] Productive cough   [] Hemoptysis   [] Wheeze  [] COPD   [] Asthma Neurologic:  [] Dizziness   [] Seizures   [] History of stroke   [] History of TIA  [] Aphasia   [] Vissual changes   [] Weakness or numbness in arm   [] Weakness or numbness in leg Musculoskeletal:   [] Joint swelling   [] Joint pain   [] Low back  pain Hematologic:  [] Easy bruising  [] Easy bleeding   [] Hypercoagulable state   [] Anemic Gastrointestinal:  [] Diarrhea   [] Vomiting  [] Gastroesophageal reflux/heartburn   [] Difficulty swallowing. Genitourinary:  [] Chronic kidney disease   [] Difficult urination  [] Frequent urination   [] Blood in urine Skin:  [] Rashes   [] Ulcers  Psychological:  [] History of anxiety   []  History of major depression.  Physical Examination  There were no vitals filed for this visit. There is no height or weight on file to calculate BMI. Gen: WD/WN, NAD Head: [place]/AT, No  temporalis wasting.  Ear/Nose/Throat: Hearing grossly intact, nares w/o erythema or drainage Eyes: PER, EOMI, sclera nonicteric.  Neck: Supple, no masses.  No bruit or JVD.  Pulmonary:  Good air movement, no audible wheezing, no use of accessory muscles.  Cardiac: RRR, normal S1, S2, no Murmurs. Vascular:  mild trophic changes, no open wounds Vessel Right Left  Radial Palpable Palpable  Brachial Palpable Palpable  Gastrointestinal: soft, non-distended. No guarding/no peritoneal signs.  Musculoskeletal: M/S 5/5 throughout.  No visible deformity.  Neurologic: CN 2-12 intact. Pain and light touch intact in extremities.  Symmetrical.  Speech is fluent. Motor exam as listed above. Psychiatric: Judgment intact, Mood & affect appropriate for pt's clinical situation. Dermatologic: No rashes or ulcers noted.  No changes consistent with cellulitis.   CBC Lab Results  Component Value Date   WBC 7.8 07/28/2020   HGB 11.9 (L) 07/28/2020   HCT 33.0 (L) 07/28/2020   MCV 85.7 07/28/2020   PLT 277 07/28/2020    BMET    Component Value Date/Time   NA 138 07/28/2020 0356   K 3.6 07/28/2020 0356   CL 106 07/28/2020 0356   CO2 23 07/28/2020 0356   GLUCOSE 127 (H) 07/28/2020 0356   BUN 13 07/28/2020 0356   CREATININE 0.77 07/28/2020 0356   CALCIUM 8.3 (L) 07/28/2020 0356   GFRNONAA >60 07/28/2020 0356   GFRAA >60 08/31/2019 1840   CrCl cannot be calculated (Patient's most recent lab result is older than the maximum 21 days allowed.).  COAG No results found for: "INR", "PROTIME"  Radiology No results found.   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  I do not believe the intermittent pains he is describing are related to his arterial embolization or thrombosis.  He has no obvious residual deficit and the chest complaints would certainly fit more with a  musculoskeletal process.  He is off Eliquis. Anticoagulation is being managed by his primary service.    2. Primary hypertension Continue antihypertensive medications as already ordered, these medications have been reviewed and there are no changes at this time.   3. Mixed hyperlipidemia Continue statin as ordered and reviewed, no changes at this time     , MD  02/27/2022 8:13 AM

## 2022-02-28 ENCOUNTER — Ambulatory Visit (INDEPENDENT_AMBULATORY_CARE_PROVIDER_SITE_OTHER): Payer: 59 | Admitting: Vascular Surgery

## 2022-02-28 ENCOUNTER — Encounter (INDEPENDENT_AMBULATORY_CARE_PROVIDER_SITE_OTHER): Payer: Self-pay | Admitting: Vascular Surgery

## 2022-02-28 VITALS — BP 148/82 | HR 79 | Resp 16 | Wt 189.0 lb

## 2022-02-28 DIAGNOSIS — I742 Embolism and thrombosis of arteries of the upper extremities: Secondary | ICD-10-CM | POA: Diagnosis not present

## 2022-02-28 DIAGNOSIS — I10 Essential (primary) hypertension: Secondary | ICD-10-CM

## 2022-02-28 DIAGNOSIS — E782 Mixed hyperlipidemia: Secondary | ICD-10-CM | POA: Diagnosis not present

## 2022-03-03 ENCOUNTER — Encounter (INDEPENDENT_AMBULATORY_CARE_PROVIDER_SITE_OTHER): Payer: Self-pay | Admitting: Vascular Surgery

## 2022-05-01 ENCOUNTER — Ambulatory Visit
Admission: EM | Admit: 2022-05-01 | Discharge: 2022-05-01 | Disposition: A | Discharge status: Home / Self Care | Payer: 59 | Attending: Emergency Medicine | Admitting: Emergency Medicine

## 2022-05-01 DIAGNOSIS — S0502XA Injury of conjunctiva and corneal abrasion without foreign body, left eye, initial encounter: Secondary | ICD-10-CM | POA: Diagnosis not present

## 2022-05-01 MED ORDER — FLUORESCEIN SODIUM 1 MG OP STRP: 1.0000 | ORAL_STRIP | Freq: Once | OPHTHALMIC | Status: DC

## 2022-05-01 MED ORDER — TOBRAMYCIN-DEXAMETHASONE 0.3-0.1 % OP SUSP: 2.0000 [drp] | Freq: Four times a day (QID) | OPHTHALMIC | 0 refills | Status: AC

## 2022-05-01 MED ORDER — TETRACAINE HCL 0.5 % OP SOLN: 2.0000 [drp] | Freq: Once | OPHTHALMIC | Status: DC

## 2022-05-01 NOTE — ED Provider Notes (Signed)
[facility]    CSN: [id number] Arrival date & time: 05/01/22  1409      History   Chief Complaint Chief Complaint  Patient presents with   Eye Problem    LT eye    HPI Jesse Chapman is a 56 y.o. male.   HPI  56 year old male here for evaluation of left eye pain.  Patient reports that he woke this morning around 5 AM complaining of his left eye watering and feeling like there was a foreign body in his eye.  He states that he does have a history of dry eye and uses lubricating drops but that did not help.  He states that the pain increases when he moves his eye or blinks.  He is also light sensitive.  He is unaware of getting any foreign body in his eye he does not wear contact lenses.  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 ([place]) 11/09/2020   Hyperlipidemia 08/09/2020   Limb ischemia    Ischemia of right upper extremity 07/26/2020   HTN (hypertension) 07/26/2020   Leukocytosis 07/26/2020   Abnormal finding on diagnostic imaging of right kidney 07/26/2020   Bowel obstruction ([place])    SBO (small bowel obstruction) ([place]) 07/25/2020   Arterial occlusion 07/25/2020   Obesity (BMI 30-39.9) 07/25/2020    Past Surgical History:  Procedure Laterality Date   COLONOSCOPY WITH PROPOFOL N/A 02/02/2021   Procedure: COLONOSCOPY WITH PROPOFOL;  Surgeon: Robert Bellow, MD;  Location: [place];  Service: Gastroenterology;  Laterality: N/A;  ELIQUIS   GALLBLADDER SURGERY     UPPER EXTREMITY ANGIOGRAPHY Right 07/25/2020   Procedure: Upper Extremity Angiography;  Surgeon: Katha Cabal, MD;  Location: [place] [facility];  Service: Cardiovascular;  Laterality: Right;       Home Medications    Prior to Admission medications   Medication Sig Start Date End Date Taking?  Authorizing Provider  ELIQUIS 5 MG TABS tablet TAKE 1 TABLET(5 MG) BY MOUTH TWICE DAILY 11/29/21  Yes Schnier, Dolores Lory, MD  tobramycin-dexamethasone [facility]) ophthalmic solution Place 2 drops into the left eye every 6 (six) hours. 05/01/22  Yes Margarette Canada, NP  amLODipine (NORVASC) 5 MG tablet Take 1 tablet (5 mg total) by mouth daily. Patient not taking: Reported on 02/28/2022 10/16/20   Kate Sable, MD  amLODipine (NORVASC) 5 MG tablet Take 5 mg by mouth daily. Patient not taking: Reported on 02/28/2022    [provider]  clotrimazole-betamethasone (LOTRISONE) cream Apply 1 application topically 2 (two) times daily. Patient not taking: Reported on 02/28/2022 08/28/21   Felipa Furnace, DPM  pravastatin (PRAVACHOL) 20 MG tablet Take 1 tablet (20 mg total) by mouth every evening. No further refills until seen in office. Thank you! 11/29/21 02/27/22  Kate Sable, MD  terbinafine (LAMISIL) 250 MG tablet Take 1 tablet (250 mg total) by mouth daily. Patient not taking: Reported on 02/28/2022 01/17/21   Felipa Furnace, DPM  terbinafine (LAMISIL) 250 MG tablet Take 1 tablet (250 mg total) by mouth daily. Patient not taking: Reported on 02/28/2022 04/27/21   Felipa Furnace, DPM  terbinafine (LAMISIL) 250 MG tablet Take 1 tablet (250 mg total) by mouth daily. Patient not taking: Reported on 02/28/2022 05/08/21   Felipa Furnace, DPM  valACYclovir (VALTREX) 1000 MG tablet Take 1,000 mg by mouth 3 (three) times daily. Patient not taking: Reported on 02/28/2022 09/15/20   [provider]    Family History Family History  Problem Relation Age of Onset   Healthy Mother    Healthy Father     Social History Social History   Tobacco Use   Smoking status: Former    Types: Cigarettes   Smokeless tobacco: Never  Vaping Use   Vaping Use: Never used  Substance Use Topics   Alcohol use: Not Currently   Drug use: Never     Allergies   Statins and Pravastatin  sodium   Review of Systems Review of Systems  Eyes:  Positive for photophobia, pain, discharge and redness. Negative for itching and visual disturbance.     Physical Exam Triage Vital Signs ED Triage Vitals  Enc Vitals Group     BP 05/01/22 1419 (S) (!) 166/115     Pulse Rate 05/01/22 1419 90     Resp --      Temp 05/01/22 1419 97.9 F (36.6 C)     Temp Source 05/01/22 1419 Oral     SpO2 05/01/22 1419 97 %     Weight 05/01/22 1418 192 lb (87.1 kg)     Height 05/01/22 1418 5\' 3"  (1.6 m)     Head Circumference --      Peak Flow --      Pain Score 05/01/22 1418 0     Pain Loc --      Pain Edu? --      Excl. in GC? --    No data found.  Updated Vital Signs BP (!) 158/94 (BP Location: Left Arm)   Pulse 90   Temp 97.9 F (36.6 C) (Oral)   Ht 5\' 3"  (1.6 m)   Wt 192 lb (87.1 kg)   SpO2 97%   BMI 34.01 kg/m   Visual Acuity Right Eye Distance: 20/40 (Uncorrected) Left Eye Distance: 20/50 (Uncorrected) Bilateral Distance: 20/50 (Uncorrected)  Right Eye Near:   Left Eye Near:    Bilateral Near:     Physical Exam Vitals and nursing note reviewed.  Constitutional:      Appearance: Normal appearance. He is not ill-appearing.  HENT:     Head: Normocephalic and atraumatic.  Eyes:     General: No scleral icterus.       Left eye: Discharge present.    Extraocular Movements: Extraocular movements intact.     Pupils: Pupils are equal, round, and reactive to light.     Comments: Bulbar conjunctiva of the left eye is mildly injected and the labile conjunctiva of the upper and lower eyelids are erythematous and mildly edematous.  Patient's pupils equal round reactive and is EOMs intact.  He has normal red light reflex in that eye.  Skin:    General: Skin is warm and dry.     Capillary Refill: Capillary refill takes less than 2 seconds.  Neurological:     General: No focal deficit present.     Mental Status: He is alert and oriented to person, place, and time.   Psychiatric:        Mood and Affect: Mood normal.        Behavior: Behavior normal.        Thought Content: Thought content normal.        Judgment: Judgment normal.      UC Treatments / Results  Labs (all labs ordered are listed,  but only abnormal results are displayed) Labs Reviewed - No data to display  EKG   Radiology No results found.  Procedures Procedures (including critical care time)  Medications Ordered in UC Medications - No data to display  Initial Impression / Assessment and Plan / UC Course  I have reviewed the triage vital signs and the nursing notes.  Pertinent labs & imaging results that were available during my care of the patient were reviewed by me and considered in my medical decision making (see chart for details).   Patient is a pleasant, nontoxic-appearing 56 year old male here for evaluation of left eye watering, redness, and pain that started on 5 AM this morning.  He states he feels like it is a foreign body in his eye but does not remember getting a foreign body in his eye.  The pain increases when he blinks or moves his eye from side to side.  He is also light sensitive.  His visual acuity is OU 20/50, OD 20/40, OS 20/50.  I do not visualize a foreign body in the patient's eye on visual exam.  I did evert the upper eyelid and could not find any trace of a foreign body.  I anesthetized his eye with 2 drops of tetracaine and then instilled fluorescein dye and examined the eye under [place] lamp.  Patient has a large corneal abrasion in the middle of his vision in the left eye.  I have advised the patient to not rub his eye as to allow the cornea to regenerate and prevent worsening of his symptoms.  I will put him on TobraDex, 2 drops every 6 hours x5 days, to help with pain and inflammation as well as prevent infection.  I have advised the patient that if his symptoms do not improve, or they worsen, he needs to follow-up with an eye doctor.  Patient denies  need for work note.   Final Clinical Impressions(s) / UC Diagnoses   Final diagnoses:  Abrasion of left cornea, initial encounter     Discharge Instructions      Instill 2 drops of TobraDex in your left eye 4 times a day for 5 days.  Avoid rubbing your eye as this will make the pain worse.  If you have any worsening of your symptoms such as increased pain, increased redness, increased photosensitivity, or changes in your vision you need to follow-up with your eye doctor.      ED Prescriptions     Medication Sig Dispense Auth. Provider   tobramycin-dexamethasone [facility]) ophthalmic solution Place 2 drops into the left eye every 6 (six) hours. 5 mL Becky Augusta, NP      PDMP not reviewed this encounter.   Becky Augusta, NP 05/01/22 1435

## 2022-05-01 NOTE — Discharge Instructions (Signed)
Instill 2 drops of TobraDex in your left eye 4 times a day for 5 days.  Avoid rubbing your eye as this will make the pain worse.  If you have any worsening of your symptoms such as increased pain, increased redness, increased photosensitivity, or changes in your vision you need to follow-up with your eye doctor.

## 2022-05-01 NOTE — ED Triage Notes (Signed)
Pt c/o LT eye watery, feels like something is in eye since 5am this morning, pt states feels like dust

## 2022-06-22 ENCOUNTER — Other Ambulatory Visit (INDEPENDENT_AMBULATORY_CARE_PROVIDER_SITE_OTHER): Payer: Self-pay | Admitting: Vascular Surgery

## 2022-08-16 ENCOUNTER — Encounter: Payer: Self-pay | Admitting: *Deleted

## 2022-08-22 IMAGING — CR DG ABDOMEN 2V
3 series · 3 of 3 positions shown · non-contrast
Comparison: CT [REDACTED]

CLINICAL DATA: Bowel obstruction

EXAM:
ABDOMEN - 2 VIEW

[abdomen erect]
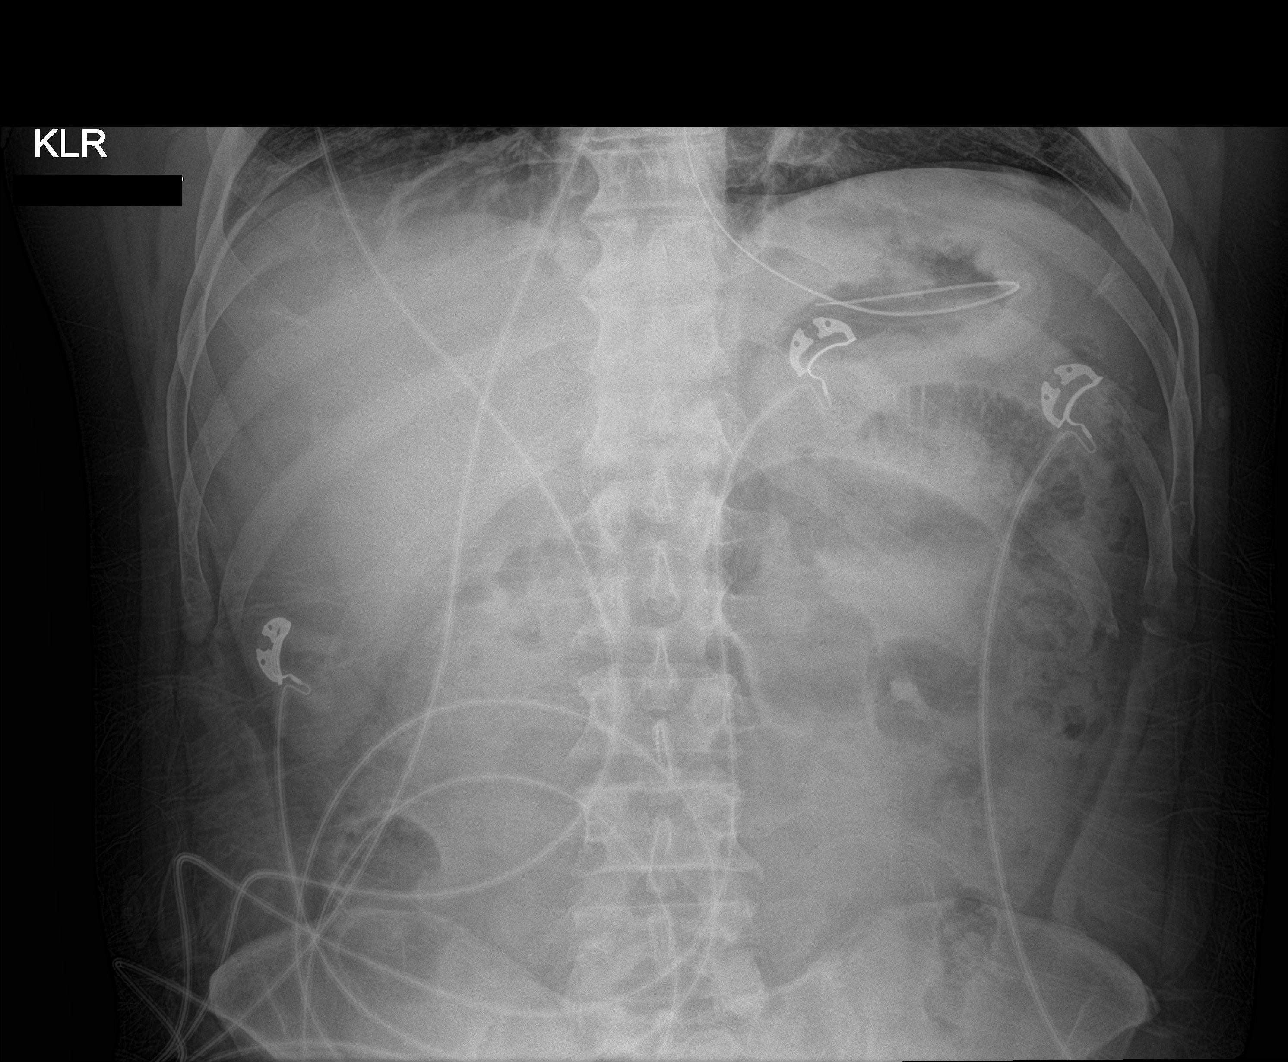

[abdomen supine (1 of 2)]
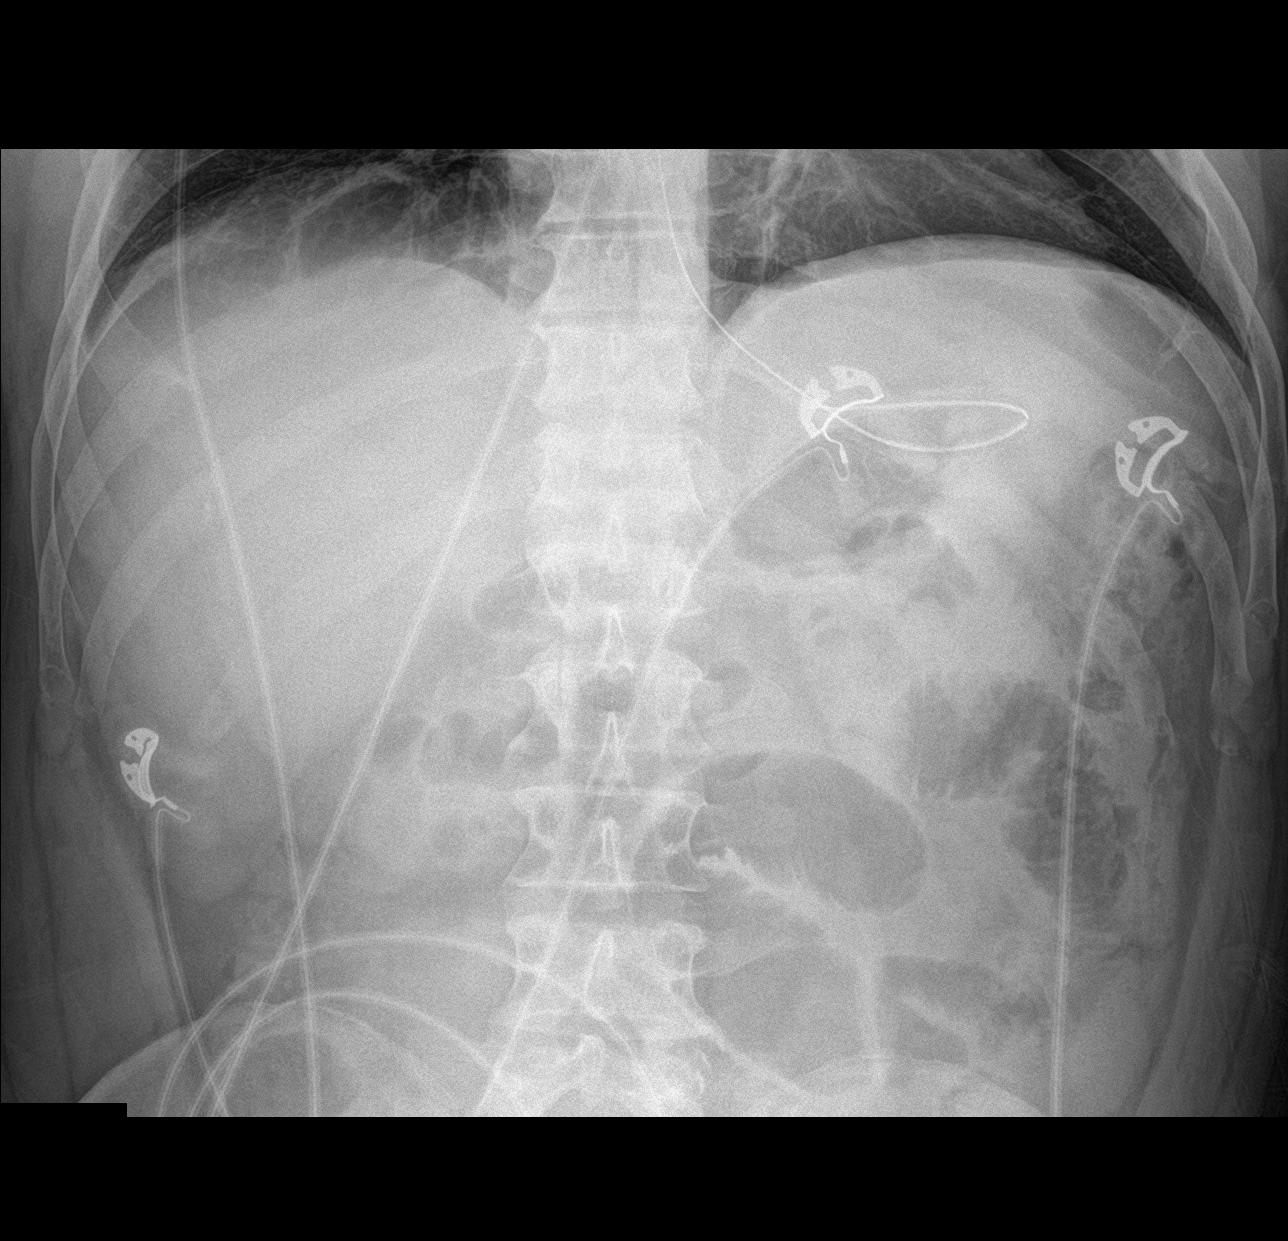

[abdomen supine (2 of 2)]
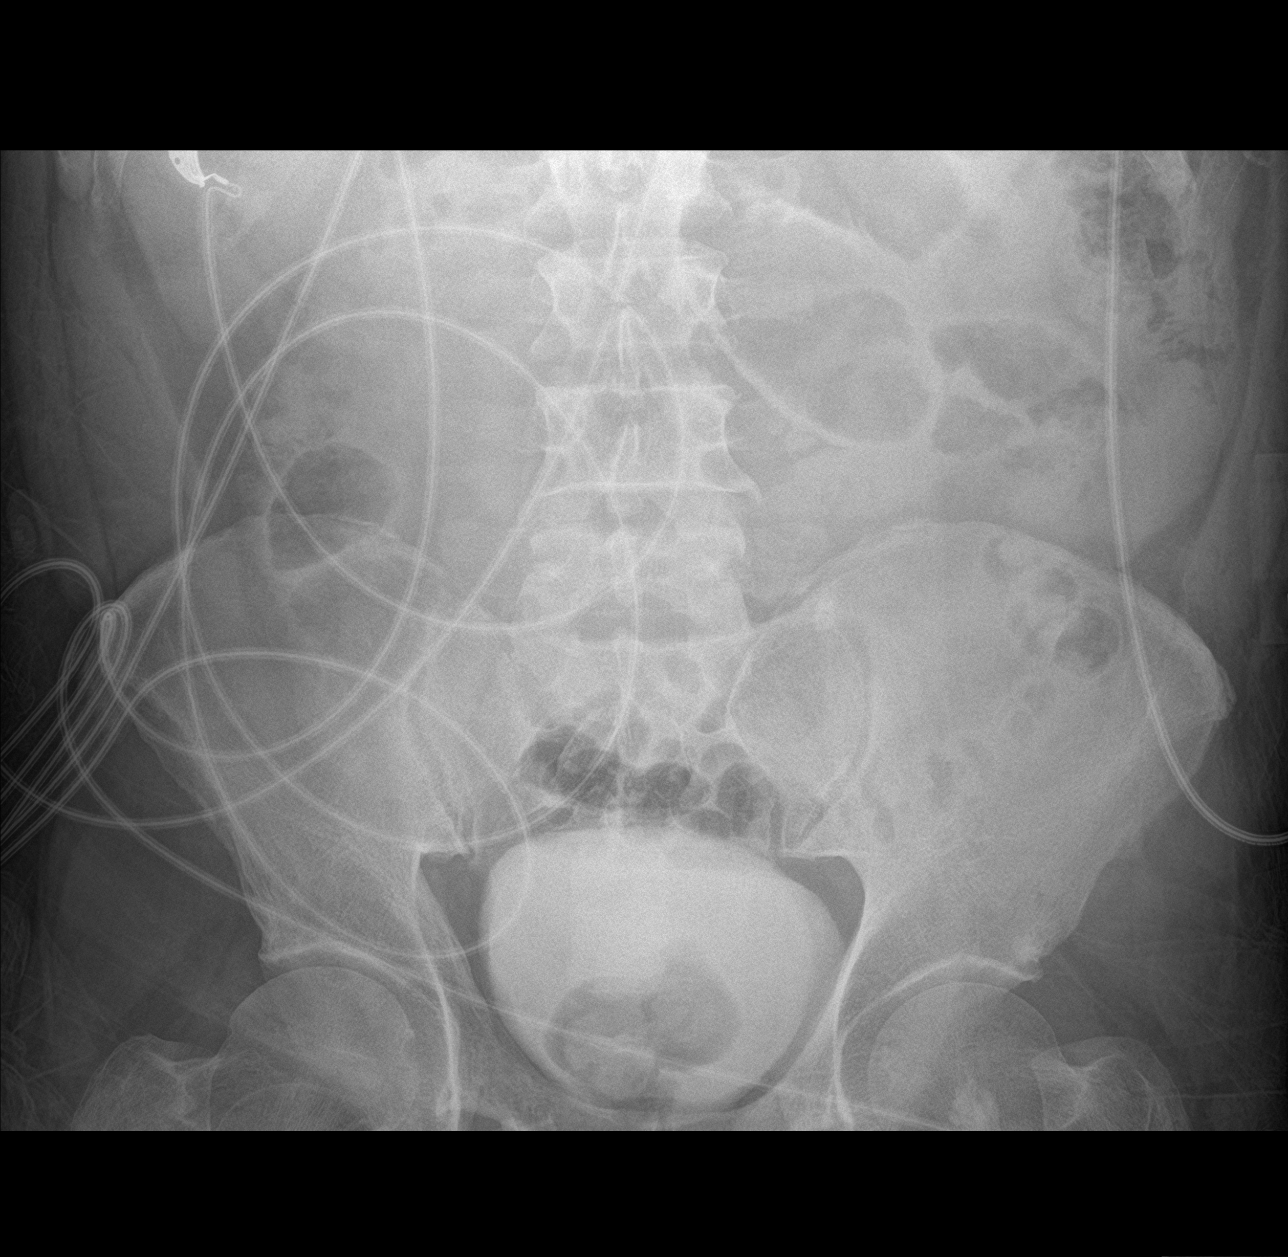

[3 of 3 positions shown; findings below may reference images not displayed]

FINDINGS: Nasogastric tube coiled in proximal stomach.

Few mildly prominent air-filled loops of small bowel in the LEFT mid
abdomen.

These have slightly decreased in caliber and number since previous
exam.

Excreted contrast within urinary bladder.

No bowel wall thickening or free air.

Minimal atelectasis at lung bases.

Sclerotic focus at the LEFT femoral head unchanged, nonspecific

Osseous structures otherwise unremarkable.
IMPRESSION: Decreased small bowel distension since prior exam.

## 2022-08-23 ENCOUNTER — Encounter: Payer: Self-pay | Admitting: Cardiology

## 2022-08-23 ENCOUNTER — Ambulatory Visit: Payer: Self-pay | Attending: Cardiology | Admitting: Cardiology

## 2022-08-23 VITALS — BP 138/86 | HR 76 | Ht 65.0 in | Wt 189.8 lb

## 2022-08-23 DIAGNOSIS — I749 Embolism and thrombosis of unspecified artery: Secondary | ICD-10-CM

## 2022-08-23 DIAGNOSIS — I10 Essential (primary) hypertension: Secondary | ICD-10-CM

## 2022-08-23 DIAGNOSIS — M79601 Pain in right arm: Secondary | ICD-10-CM

## 2022-08-23 MED ORDER — LOSARTAN POTASSIUM 25 MG PO TABS: 25.0000 mg | ORAL_TABLET | Freq: Every day | ORAL | 3 refills | Status: DC

## 2022-08-23 NOTE — Patient Instructions (Addendum)
Medication Instructions:   Losartn: tome una tableta (25 mg) por va oral al da.  START Losartan - take one tablet ( 25 mg) by mouth daily.   *If you need a refill on your cardiac medications before your next appointment, please call your pharmacy*   Lab Work:  1. [place] ordenado  None ordered  If you have labs (blood work) drawn today and your tests are completely normal, you will receive your results only by: [place] (if you have MyChart) OR A paper copy in the mail If you have any lab test that is abnormal or we need to change your treatment, we will call you to review the results.   Testing/Procedures:  1. N[place]uno ordenado  None Ordered   Follow-Up: At [facility], you and your health needs are our priority.  As part of our continuing mission to provide you with exceptional heart care, we have created designated Provider Care Teams.  These Care Teams include your primary Cardiologist (physician) and Advanced Practice Providers (APPs -  Physician Assistants and Nurse Practitioners) who all work together to provide you with the care you need, when you need it.  We recommend signing up for the patient portal called "MyChart".  Sign up information is provided on this After Visit Summary.  MyChart is used to connect with patients for Virtual Visits (Telemedicine).  Patients are able to view lab/test results, encounter notes, upcoming appointments, etc.  Non-urgent messages can be sent to your provider as well.   To learn more about what you can do with MyChart, go to [email or web address].    Your next appointment:    3 meses  3 month(s)  Provider:   You may see Kate Sable, MD or one of the following Advanced Practice Providers on your designated Care Team:   Murray Hodgkins, NP Christell Faith, PA-C Cadence Kathlen Mody, PA-C Gerrie Nordmann, NP

## 2022-08-23 NOTE — Progress Notes (Signed)
Cardiology Office Note:    Date:  08/23/2022   ID:  Jesse Chapman, DOB 1965/09/15, MRN [id number]  PCP:  [facility]  Cardiologist:  Kate Sable, MD  Advanced Practice Provider:  No care team member to display Electrophysiologist:  None       Referring MD: [place]   Chief Complaint  Patient presents with   Follow-up    12 month f/u, having some headaches, pain in right hang     History of Present Illness:    Jesse Chapman is a 57 y.o. 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    Previously seen for hypertension, claudication, ABI was unrevealing.  Started on Norvasc for BP, did not tolerate Norvasc as such she stopped taking.  Denies chest pain or shortness of breath.  Endorses having right arm pain usually when he crosses his arms.  Followed up with vascular surgery, advised to continue Eliquis.  Prior notes Echocardiogram 07/26/2020 showed normal systolic and diastolic function, EF 60 to 65%. Cardiac monitor 09/2020 with no evidence for A-fib or flutter.  seen in the hospital on 07/25/2020 due to right upper extremity pain and numbness.  CT angio of the right upper extremity showed an acute arterial occlusion of the distal brachial artery at the elbow, thromboembolus was suspected.  Patient underwent mechanical thrombectomy with vascular surgery.  Was started on Eliquis postprocedure.   Past Medical History:  Diagnosis Date   Clotting disorder ([place])    Hyperlipidemia    Hypertension     Past Surgical History:  Procedure Laterality Date   COLONOSCOPY WITH PROPOFOL N/A 02/02/2021   Procedure: COLONOSCOPY WITH PROPOFOL;  Surgeon: Robert Bellow, MD;  Location: [facility];  Service: Gastroenterology;  Laterality: N/A;  ELIQUIS   GALLBLADDER SURGERY      UPPER EXTREMITY ANGIOGRAPHY Right 07/25/2020   Procedure: Upper Extremity Angiography;  Surgeon: Katha Cabal, MD;  Location: [place] [facility];  Service: Cardiovascular;  Laterality: Right;    Current Medications: Current Meds  Medication Sig   ELIQUIS 5 MG TABS tablet TAKE 1 TABLET BY MOUTH TWICE A DAY   losartan (COZAAR) 25 MG tablet Take 1 tablet (25 mg total) by mouth daily.   tobramycin-dexamethasone (TOBRADEX) ophthalmic solution Place 2 drops into the left eye every 6 (six) hours.     Allergies:   Statins and Pravastatin sodium   Social History   Socioeconomic History   Marital status: Single    Spouse name: Not on file   Number of children: Not on file   Years of education: Not on file   Highest education level: Not on file  Occupational History   Not on file  Tobacco Use   Smoking status: Former    Types: Cigarettes   Smokeless tobacco: Never  Vaping Use   Vaping Use: Never used  Substance and Sexual Activity   Alcohol use: Not Currently   Drug use: Never   Sexual activity: Not on file  Other Topics Concern   Not on file  Social History Narrative   ** Merged History Encounter **       Social Determinants of Health   Financial Resource Strain: Not on file  Food Insecurity: Not on file  Transportation Needs: Not on file  Physical Activity: Not on file  Stress: Not on  file  Social Connections: Not on file     Family History: The patient's family history includes Healthy in his father and mother.  ROS:   Please see the history of present illness.     All other systems reviewed and are negative.  EKGs/Labs/Other Studies Reviewed:    The following studies were reviewed today:   EKG:  EKG is  ordered today.  The ekg ordered today demonstrates sinus rhythm  Recent Labs: No results found for requested labs within last 365 days.  Recent Lipid Panel No results found for: "CHOL", "TRIG", "HDL", "CHOLHDL", "VLDL", "LDLCALC",  "LDLDIRECT"   Risk Assessment/Calculations:      Physical Exam:    VS:  BP 138/86 (BP Location: Left Arm, Patient Position: Sitting, Cuff Size: Large)   Pulse 76   Ht 5' 5"$  (1.651 m)   Wt 189 lb 12.8 oz (86.1 kg)   SpO2 97%   BMI 31.58 kg/m     Wt Readings from Last 3 Encounters:  08/23/22 189 lb 12.8 oz (86.1 kg)  05/01/22 192 lb (87.1 kg)  02/28/22 189 lb (85.7 kg)     GEN:  Well nourished, well developed in no acute distress HEENT: Normal NECK: No JVD; No carotid bruits CARDIAC: RRR, no murmurs, rubs, gallops RESPIRATORY:  Clear to auscultation without rales, wheezing or rhonchi  ABDOMEN: Soft, non-tender, non-distended MUSCULOSKELETAL:  No edema; No deformity  SKIN: Warm and dry NEUROLOGIC:  Alert and oriented x 3 PSYCHIATRIC:  Normal affect   ASSESSMENT:    1. Primary hypertension   2. Right arm pain   3. Thromboembolism (HCC)    PLAN:    In order of problems listed above:  Hypertension, BP elevated, start losartan 25 mg daily. Right arm pain when cross.  Appears neurologic.  Follow-up with PCP, consider neuro eval.  Denies chest pain or shortness of breath. Prior thromboembolic event involving the right brachial artery.  S/p mechanical thrombectomy by vascular surgery.  Echo 07/2020 EF 60%.  On Eliquis, management per vascular surgery.     Follow-up in 3 months.  Spanish interpreter used for this visit.   Medication Adjustments/Labs and Tests Ordered: Current medicines are reviewed at length with the patient today.  Concerns regarding medicines are outlined above.  Orders Placed This Encounter  Procedures   EKG 12-Lead   Meds ordered this encounter  Medications   losartan (COZAAR) 25 MG tablet    Sig: Take 1 tablet (25 mg total) by mouth daily.    Dispense:  90 tablet    Refill:  3    Patient Instructions  Medication Instructions:   Losartn: tome una tableta (25 mg) por va oral al da.  START Losartan - take one tablet ( 25 mg) by mouth  daily.   *If you need a refill on your cardiac medications before your next appointment, please call your pharmacy*   Lab Work:  1. Ninguno ordenado  None ordered  If you have labs (blood work) drawn today and your tests are completely normal, you will receive your results only by: [place] (if you have MyChart) OR A paper copy in the mail If you have any lab test that is abnormal or we need to change your treatment, we will call you to review the results.   Testing/Procedures:  1. Ninguno ordenado  None Ordered   Follow-Up: At [facility], you and your health needs are our priority.  As part of our continuing mission to provide you with  exceptional heart care, we have created designated Provider Care Teams.  These Care Teams include your primary Cardiologist (physician) and Advanced Practice Providers (APPs -  Physician Assistants and Nurse Practitioners) who all work together to provide you with the care you need, when you need it.  We recommend signing up for the patient portal called "MyChart".  Sign up information is provided on this After Visit Summary.  MyChart is used to connect with patients for Virtual Visits (Telemedicine).  Patients are able to view lab/test results, encounter notes, upcoming appointments, etc.  Non-urgent messages can be sent to your provider as well.   To learn more about what you can do with MyChart, go to [email or web address].    Your next appointment:    3 meses  3 month(s)  Provider:   You may see Kate Sable, MD or one of the following Advanced Practice Providers on your designated Care Team:   Murray Hodgkins, NP Christell Faith, PA-C Cadence Kathlen Mody, PA-C Gerrie Nordmann, NP    Signed, Kate Sable, MD  08/23/2022 9:46 AM    [place]

## 2022-11-21 ENCOUNTER — Encounter: Payer: Self-pay | Admitting: Cardiology

## 2022-11-21 ENCOUNTER — Ambulatory Visit: Payer: BC Managed Care – PPO | Attending: Cardiology | Admitting: Cardiology

## 2022-11-21 VITALS — BP 138/86 | HR 79 | Ht 64.0 in | Wt 192.4 lb

## 2022-11-21 DIAGNOSIS — I749 Embolism and thrombosis of unspecified artery: Secondary | ICD-10-CM

## 2022-11-21 DIAGNOSIS — I10 Essential (primary) hypertension: Secondary | ICD-10-CM | POA: Diagnosis not present

## 2022-11-21 MED ORDER — LOSARTAN POTASSIUM 25 MG PO TABS: 25.0000 mg | ORAL_TABLET | Freq: Every day | ORAL | 3 refills | Status: DC

## 2022-11-21 NOTE — Progress Notes (Signed)
Cardiology Office Note:    Date:  11/21/2022   ID:  Jesse Chapman, DOB 02-09-1966, MRN [id number]  PCP:  [facility]   [facility]  Cardiologist:  Debbe Odea, MD  Advanced Practice Provider:  No care team member to display Electrophysiologist:  None       Referring MD: [facility]   Chief Complaint  Patient presents with   Follow-up    Has not started Losartan due to cost.  Patient denies new or acute cardiac problems/concerns today.      History of Present Illness:    Jesse Chapman is a 57 y.o. 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    Previously seen due to elevated blood pressures, started on losartan.  He did not take losartan due to cost issues/lack of insurance.  He now has insurance, and would like to start taking losartan.  Feels well, no new concerns at this time.  Prior notes Echocardiogram 07/26/2020 showed normal systolic and diastolic function, EF 60 to 65%. Cardiac monitor 09/2020 with no evidence for A-fib or flutter.  seen in the hospital on 07/25/2020 due to right upper extremity pain and numbness.  CT angio of the right upper extremity showed an acute arterial occlusion of the distal brachial artery at the elbow, thromboembolus was suspected.  Patient underwent mechanical thrombectomy with vascular surgery.  Was started on Eliquis postprocedure.   Past Medical History:  Diagnosis Date   Clotting disorder (HCC)    Hyperlipidemia    Hypertension     Past Surgical History:  Procedure Laterality Date   COLONOSCOPY WITH PROPOFOL N/A 02/02/2021   Procedure: COLONOSCOPY WITH PROPOFOL;  Surgeon: Earline Mayotte, MD;  Location: [facility] ENDOSCOPY;  Service: Gastroenterology;  Laterality: N/A;  ELIQUIS   GALLBLADDER SURGERY     UPPER EXTREMITY ANGIOGRAPHY Right  07/25/2020   Procedure: Upper Extremity Angiography;  Surgeon: Renford Dills, MD;  Location: [facility] INVASIVE CV LAB;  Service: Cardiovascular;  Laterality: Right;    Current Medications: Current Meds  Medication Sig   tobramycin-dexamethasone (TOBRADEX) ophthalmic solution Place 2 drops into the left eye every 6 (six) hours.     Allergies:   Statins and Pravastatin sodium   Social History   Socioeconomic History   Marital status: Single    Spouse name: Not on file   Number of children: Not on file   Years of education: Not on file   Highest education level: Not on file  Occupational History   Not on file  Tobacco Use   Smoking status: Former    Types: Cigarettes   Smokeless tobacco: Never  Vaping Use   Vaping Use: Never used  Substance and Sexual Activity   Alcohol use: Not Currently   Drug use: Never   Sexual activity: Not on file  Other Topics Concern   Not on file  Social History Narrative   ** Merged History Encounter **       Social Determinants of Health   Financial Resource Strain: Not on file  Food Insecurity: Not on file  Transportation Needs: Not on file  Physical Activity: Not on file  Stress: Not on file  Social Connections: Not on file     Family History: The patient's family history includes Healthy in his father and mother.  ROS:   Please see the history of present  illness.     All other systems reviewed and are negative.  EKGs/Labs/Other Studies Reviewed:    The following studies were reviewed today:   EKG:  EKG is  ordered today.  The ekg ordered today demonstrates sinus rhythm  Recent Labs: No results found for requested labs within last 365 days.  Recent Lipid Panel No results found for: "CHOL", "TRIG", "HDL", "CHOLHDL", "VLDL", "LDLCALC", "LDLDIRECT"   Risk Assessment/Calculations:      Physical Exam:    VS:  BP 138/86 (BP Location: Left Arm, Patient Position: Sitting, Cuff Size: Normal)   Pulse 79   Ht 5\' 4"  (1.626 m)    Wt 192 lb 6.4 oz (87.3 kg)   SpO2 97%   BMI 33.03 kg/m     Wt Readings from Last 3 Encounters:  11/21/22 192 lb 6.4 oz (87.3 kg)  08/23/22 189 lb 12.8 oz (86.1 kg)  05/01/22 192 lb (87.1 kg)     GEN:  Well nourished, well developed in no acute distress HEENT: Normal NECK: No JVD; No carotid bruits CARDIAC: RRR, no murmurs, rubs, gallops RESPIRATORY:  Clear to auscultation without rales, wheezing or rhonchi  ABDOMEN: Soft, non-tender, non-distended MUSCULOSKELETAL:  No edema; No deformity  SKIN: Warm and dry NEUROLOGIC:  Alert and oriented x 3 PSYCHIATRIC:  Normal affect   ASSESSMENT:    1. Primary hypertension   2. Thromboembolism (HCC)    PLAN:    In order of problems listed above:  Hypertension, BP elevated, start losartan 25 mg daily. Prior thromboembolic event involving the right brachial artery.  S/p mechanical thrombectomy..  On Eliquis, management per vascular surgery.    Follow-up in 6 months.  Spanish interpreter used for this visit.   Medication Adjustments/Labs and Tests Ordered: Current medicines are reviewed at length with the patient today.  Concerns regarding medicines are outlined above.  No orders of the defined types were placed in this encounter.  Meds ordered this encounter  Medications   losartan (COZAAR) 25 MG tablet    Sig: Take 1 tablet (25 mg total) by mouth daily.    Dispense:  90 tablet    Refill:  3    Patient Instructions  Medication Instructions:   RESTART Losartan - take one tablet ( 25mg ) by mouth daily.   *If you need a refill on your cardiac medications before your next appointment, please call your pharmacy*   Lab Work:  None Ordered  If you have labs (blood work) drawn today and your tests are completely normal, you will receive your results only by: MyChart Message (if you have MyChart) OR A paper copy in the mail If you have any lab test that is abnormal or we need to change your treatment, we will call you to  review the results.   Testing/Procedures:  None Ordered   Follow-Up: At [facility], you and your health needs are our priority.  As part of our continuing mission to provide you with exceptional heart care, we have created designated Provider Care Teams.  These Care Teams include your primary Cardiologist (physician) and Advanced Practice Providers (APPs -  Physician Assistants and Nurse Practitioners) who all work together to provide you with the care you need, when you need it.  We recommend signing up for the patient portal called "MyChart".  Sign up information is provided on this After Visit Summary.  MyChart is used to connect with patients for Virtual Visits (Telemedicine).  Patients are able to view lab/test results, encounter notes,  upcoming appointments, etc.  Non-urgent messages can be sent to your provider as well.   To learn more about what you can do with MyChart, go to [email or web address].    Your next appointment:   6 month(s)  Provider:   You may see Debbe Odea, MD or one of the following Advanced Practice Providers on your designated Care Team:   Nicolasa Ducking, NP Eula Listen, PA-C Cadence Fransico Michael, PA-C Charlsie Quest, NP    Signed, Debbe Odea, MD  11/21/2022 9:54 AM    [facility]

## 2022-11-21 NOTE — Patient Instructions (Addendum)
Medication Instructions:   RESTART Losartan - take one tablet ( 25mg ) by mouth daily.   *If you need a refill on your cardiac medications before your next appointment, please call your pharmacy*   Lab Work:  None Ordered  If you have labs (blood work) drawn today and your tests are completely normal, you will receive your results only by: MyChart Message (if you have MyChart) OR A paper copy in the mail If you have any lab test that is abnormal or we need to change your treatment, we will call you to review the results.   Testing/Procedures:  None Ordered   Follow-Up: At [facility], you and your health needs are our priority.  As part of our continuing mission to provide you with exceptional heart care, we have created designated Provider Care Teams.  These Care Teams include your primary Cardiologist (physician) and Advanced Practice Providers (APPs -  Physician Assistants and Nurse Practitioners) who all work together to provide you with the care you need, when you need it.  We recommend signing up for the patient portal called "MyChart".  Sign up information is provided on this After Visit Summary.  MyChart is used to connect with patients for Virtual Visits (Telemedicine).  Patients are able to view lab/test results, encounter notes, upcoming appointments, etc.  Non-urgent messages can be sent to your provider as well.   To learn more about what you can do with MyChart, go to [email or web address].    Your next appointment:   6 month(s)  Provider:   You may see Debbe Odea, MD or one of the following Advanced Practice Providers on your designated Care Team:   Nicolasa Ducking, NP Eula Listen, PA-C Cadence Fransico Michael, PA-C Charlsie Quest, NP

## 2023-05-27 ENCOUNTER — Ambulatory Visit: Payer: BC Managed Care – PPO | Attending: Cardiology | Admitting: Cardiology

## 2023-05-28 ENCOUNTER — Encounter: Payer: Self-pay | Admitting: Cardiology

## 2024-07-26 ENCOUNTER — Emergency Department

## 2024-07-26 ENCOUNTER — Other Ambulatory Visit: Payer: Self-pay

## 2024-07-26 ENCOUNTER — Emergency Department: Admission: EM | Admit: 2024-07-26 | Discharge: 2024-07-26 | Disposition: A | Discharge status: Home / Self Care

## 2024-07-26 DIAGNOSIS — R29810 Facial weakness: Secondary | ICD-10-CM | POA: Insufficient documentation

## 2024-07-26 DIAGNOSIS — Z862 Personal history of diseases of the blood and blood-forming organs and certain disorders involving the immune mechanism: Secondary | ICD-10-CM

## 2024-07-26 DIAGNOSIS — Z79899 Other long term (current) drug therapy: Secondary | ICD-10-CM | POA: Diagnosis not present

## 2024-07-26 DIAGNOSIS — I10 Essential (primary) hypertension: Secondary | ICD-10-CM | POA: Insufficient documentation

## 2024-07-26 DIAGNOSIS — G510 Bell's palsy: Secondary | ICD-10-CM

## 2024-07-26 DIAGNOSIS — R200 Anesthesia of skin: Secondary | ICD-10-CM | POA: Diagnosis present

## 2024-07-26 LAB — COMPREHENSIVE METABOLIC PANEL WITH GFR
ALT: 38 U/L (ref 0–44)
AST: 23 U/L (ref 15–41)
Albumin: 4.6 g/dL (ref 3.5–5.0)
Alkaline Phosphatase: 96 U/L (ref 38–126)
Anion gap: 13 (ref 5–15)
BUN: 17 mg/dL (ref 6–20)
CO2: 25 mmol/L (ref 22–32)
Calcium: 9.3 mg/dL (ref 8.9–10.3)
Chloride: 98 mmol/L (ref 98–111)
Creatinine, Ser: 1.03 mg/dL (ref 0.61–1.24)
GFR, Estimated: >60 mL/min
Glucose, Bld: 153 mg/dL — ABNORMAL HIGH (ref 70–99)
Potassium: 4.1 mmol/L (ref 3.5–5.1)
Sodium: 137 mmol/L (ref 135–145)
Total Bilirubin: 0.6 mg/dL (ref 0.0–1.2)
Total Protein: 7.6 g/dL (ref 6.5–8.1)

## 2024-07-26 LAB — CBC
HCT: 46.3 % (ref 39.0–52.0)
Hemoglobin: 16.5 g/dL (ref 13.0–17.0)
MCH: 29.8 pg (ref 26.0–34.0)
MCHC: 35.6 g/dL (ref 30.0–36.0)
MCV: 83.7 fL (ref 80.0–100.0)
Platelets: 392 K/uL (ref 150–400)
RBC: 5.53 MIL/uL (ref 4.22–5.81)
RDW: 11.7 % (ref 11.5–15.5)
WBC: 12.2 K/uL — ABNORMAL HIGH (ref 4.0–10.5)
nRBC: 0 % (ref 0.0–0.2)

## 2024-07-26 LAB — DIFFERENTIAL
Abs Immature Granulocytes: 0.07 K/uL (ref 0.00–0.07)
Basophils Absolute: 0.1 K/uL (ref 0.0–0.1)
Basophils Relative: 1 %
Eosinophils Absolute: 0.1 K/uL (ref 0.0–0.5)
Eosinophils Relative: 1 %
Immature Granulocytes: 1 %
Lymphocytes Relative: 18 %
Lymphs Abs: 2.2 K/uL (ref 0.7–4.0)
Monocytes Absolute: 0.5 K/uL (ref 0.1–1.0)
Monocytes Relative: 4 %
Neutro Abs: 9.4 K/uL — ABNORMAL HIGH (ref 1.7–7.7)
Neutrophils Relative %: 75 %

## 2024-07-26 LAB — ETHANOL: Alcohol, Ethyl (B): <15 mg/dL

## 2024-07-26 LAB — PROTIME-INR
INR: 0.9 (ref 0.8–1.2)
Prothrombin Time: 12.6 s (ref 11.4–15.2)

## 2024-07-26 LAB — APTT: aPTT: 28 s (ref 24–36)

## 2024-07-26 MED ORDER — IOHEXOL 350 MG/ML SOLN
75.0000 mL | Freq: Once | INTRAVENOUS | Status: AC | PRN
  Administered 2024-07-26: 75 mL via INTRAVENOUS

## 2024-07-26 MED ORDER — HYPROMELLOSE (GONIOSCOPIC) 2.5 % OP SOLN: 1.0000 [drp] | Freq: Four times a day (QID) | OPHTHALMIC | 12 refills | Status: DC | PRN

## 2024-07-26 MED ORDER — PREDNISONE 20 MG PO TABS: 60.0000 mg | ORAL_TABLET | Freq: Every day | ORAL | 0 refills | Status: AC

## 2024-07-26 MED ORDER — LABETALOL HCL 5 MG/ML IV SOLN
10.0000 mg | Freq: Once | INTRAVENOUS | Status: AC
  Administered 2024-07-26: 10 mg via INTRAVENOUS
  Filled 2024-07-26: qty 4

## 2024-07-26 MED ORDER — APIXABAN 5 MG PO TABS: 5.0000 mg | ORAL_TABLET | Freq: Two times a day (BID) | ORAL | 1 refills | Status: AC

## 2024-07-26 NOTE — ED Provider Notes (Signed)
 "  [facility] Provider Note    Event Date/Time   First MD Initiated Contact with Patient 07/26/24 1106     (approximate)   History   Numbness   HPI  Jesse Chapman is a 59 y.o. male with a past medical history of hypertension, limb ischemia, clotting disorder, hyperlipidemia, presenting to the emergency department complaining of left-sided facial numbness and weakness which he noticed when he awoke around 7 this morning.  He reports that he went to bed around midnight last night and had a headache which have been ongoing for the last few days but was not having any of the numbness or weakness.  He reports that when he tries to drink something the water dripped out of his mouth.  He also reports that he got soap in his eyes this morning because he was not able to close his left eye in the shower.  He reports that he is currently supposed to be on Eliquis  but that he cannot afford the medication and so has not been taking it for approximately a year.     Physical Exam   Triage Vital Signs: ED Triage Vitals  Encounter Vitals Group     BP 07/26/24 1101 (!) 221/114     Girls Systolic BP Percentile --      Girls Diastolic BP Percentile --      Boys Systolic BP Percentile --      Boys Diastolic BP Percentile --      Pulse Rate 07/26/24 1100 99     Resp 07/26/24 1100 20     Temp 07/26/24 1100 98.2 F (36.8 C)     Temp src --      SpO2 07/26/24 1103 99 %     Weight 07/26/24 1101 185 lb (83.9 kg)     Height 07/26/24 1101 5' 3 (1.6 m)     Head Circumference --      Peak Flow --      Pain Score 07/26/24 1101 4     Pain Loc --      Pain Education --      Exclude from Growth Chart --     Most recent vital signs: Vitals:   07/26/24 1101 07/26/24 1103  BP: (!) 221/114   Pulse:    Resp:    Temp:    SpO2:  99%     General: Awake, no distress.  CV:  Good peripheral perfusion.  Resp:  Normal effort.  Abd:  No distention.  Other:  Decreased  sensation to the left side of face, facial droop noted worse to the lower half of face than upper half, NIH of 4, 5 out of 5 strength in bilateral upper and lower extremities with normal sensation throughout all extremities.   ED Results / Procedures / Treatments   Labs (all labs ordered are listed, but only abnormal results are displayed) Labs Reviewed  PROTIME-INR  APTT  CBC  DIFFERENTIAL  COMPREHENSIVE METABOLIC PANEL WITH GFR  ETHANOL     EKG     RADIOLOGY Head CT: IMPRESSION:  1. No acute intracranial abnormality.  2. Mild periventricular white matter hypoattenuation, likely sequela of chronic  small vessel ischemic disease.  3. Calcified dural-based extra-axial mass along the left frontal convexity  measuring 10 x 7 mm, compatible with meningioma. D    CTA head and neck: IMPRESSION:  1. No large vessel occlusion or significant stenosis in the neck.  2. Mild intracranial atherosclerosis including  mild stenosis of the left  cavernous ICA and moderate right P2 stenosis.  3. 10 x 6 mm enhancing structure over the left frontal convexity most  resembling an isolated venous varix.      PROCEDURES:  Critical Care performed: No  Procedures   MEDICATIONS ORDERED IN ED: Medications - No data to display   IMPRESSION / MDM / ASSESSMENT AND PLAN / ED COURSE  I reviewed the triage vital signs and the nursing notes.                              Differential diagnosis includes, but is not limited to, ischemic stroke, intracranial hemorrhage, intracranial mass, Bell's palsy  Patient's presentation is most consistent with acute presentation with potential threat to life or bodily function.  Patient is a 59 year old male with a past medical history of arterial occlusion, limb ischemia, clotting disorder not currently on Eliquis , hyperlipidemia, hypertension, presenting to the emergency department for left-sided facial numbness and weakness which he first noticed when  he woke up this morning.  The patient's blood work is overall unremarkable aside from a slightly elevated white blood cell count.  Head CT does not show any acute pathology.  I discussed with the patient that his symptoms are concerning for stroke versus Bell's palsy.  We discussed the differences in these diagnoses.  I discussed that while his symptoms are consistent with Bell's palsy given his history of embolism and thrombosis I was concerned enough for stroke to want to evaluate further.  We discussed plan for MRI.  If MRI is negative patient will be discharged with treatment for Bell's palsy.  If MRI shows evidence of stroke we admitted for further workup.      FINAL CLINICAL IMPRESSION(S) / ED DIAGNOSES   Final diagnoses:  Facial weakness     Rx / DC Orders   ED Discharge Orders     None        Note:  This document was prepared using Dragon voice recognition software and may include unintentional dictation errors.   Rexford Reche HERO, MD 07/26/24 1537  "

## 2024-07-26 NOTE — Discharge Instructions (Addendum)
 Tome la prednisona una vez al da durante la prxima semana.  Use las gotas de lgrimas artificiales segn sea necesario. Debe cubrirse el ojo con ignacia gasa y cinta adhesiva por la noche.  Le hemos recetado un suministro de Eliquis  para un mes con una recarga. Sin embargo, scientist, clinical (histocompatibility and immunogenetics) con su mdico de cabecera lo antes posible para determinar si an necesita tomarlo o si se puede reducir la dosis.  Regrese a la sala de emergencias si presenta sntomas nuevos, que empeoran o persisten, como debilidad o entumecimiento, cambios en la visin o el habla, o cualquier otro sntoma que le preocupe.

## 2024-07-26 NOTE — Consult Note (Signed)
 Requesting Physician: Rexford    Chief Complaint: Left facial droop  I have been asked by Dr. Rexford to see this patient in consultation for facial droop.  HPI: Jesse Chapman is an 59 y.o. male with  a history of a clotting disorder (prescribed Eliquis  of which he has not been on for the past year, taking ASA) , HLD and HTN who presents with complaint of left facial droop.  Patient reports that he began to have a pain behind his left ear that started about a week ago.  Pain was severe and radiated to the entire left side of his head.  Wen to bed last evening at baseline.  This AM noted left facial drooping and numbness.  Fluids were coming out of the left side of his mouth when he drank.  Also notes a change in his taste.  Has fullness in the left nostril.  No excessive tearing from the left eye but does note that the left eye is different from the right.  No change in vision or extremity numbness or weakness.    Date last known well: 07/25/2024 Time last known well: 2300 tPA Given: No: Outside time window, nondisabling deficits Thrombectomy candidate: Nondisabling deficits  Past Medical History:  Diagnosis Date   Clotting disorder    Hyperlipidemia    Hypertension     Past Surgical History:  Procedure Laterality Date   COLONOSCOPY WITH PROPOFOL  N/A 02/02/2021   Procedure: COLONOSCOPY WITH PROPOFOL ;  Surgeon: Dessa Reyes ORN, MD;  Location: [facility] ENDOSCOPY;  Service: Gastroenterology;  Laterality: N/A;  ELIQUIS    GALLBLADDER SURGERY     UPPER EXTREMITY ANGIOGRAPHY Right 07/25/2020   Procedure: Upper Extremity Angiography;  Surgeon: Jama Cordella MATSU, MD;  Location: [facility] IN[facility]ASIVE [facility];  Service: Cardiovascular;  Laterality: Right;    Family History  Problem Relation Age of Onset   Healthy Mother    Healthy Father    Social History:  reports that he has quit smoking. His smoking use included cigarettes. He has never used smokeless tobacco. He reports that he does not  currently use alcohol. He reports that he does not use drugs.  Allergies: Allergies[1]  Medications:  Prior to Admission medications  Medication Sig Start Date End Date Taking? Authorizing Provider  clotrimazole -betamethasone  (LOTRISONE ) cream Apply 1 application topically 2 (two) times daily. Patient not taking: Reported on 02/28/2022 08/28/21   Tobie Franky SQUIBB, DPM  losartan  (COZAAR ) 25 MG tablet Take 1 tablet (25 mg total) by mouth daily. 11/21/22 11/16/23  Darliss Rogue, MD  terbinafine  (LAMISIL ) 250 MG tablet Take 1 tablet (250 mg total) by mouth daily. Patient not taking: Reported on 02/28/2022 01/17/21   Tobie Franky SQUIBB, DPM  terbinafine  (LAMISIL ) 250 MG tablet Take 1 tablet (250 mg total) by mouth daily. Patient not taking: Reported on 02/28/2022 04/27/21   Tobie Franky SQUIBB, DPM  terbinafine  (LAMISIL ) 250 MG tablet Take 1 tablet (250 mg total) by mouth daily. Patient not taking: Reported on 02/28/2022 05/08/21   Tobie Franky SQUIBB, DPM  tobramycin -dexamethasone  (TOBRADEX ) ophthalmic solution Place 2 drops into the left eye every 6 (six) hours. 05/01/22   Bernardino Ditch, NP  valACYclovir (VALTREX) 1000 MG tablet Take 1,000 mg by mouth 3 (three) times daily. Patient not taking: Reported on 02/28/2022 09/15/20   [provider]   ASA 81mg  daily  ROS: History obtained from the patient  General ROS: negative for - chills, fatigue, fever, night sweats, weight gain or weight loss Psychological ROS: negative for -  behavioral disorder, hallucinations, memory difficulties, mood swings or suicidal ideation Ophthalmic ROS: negative for - blurry vision, double vision, eye pain or loss of vision ENT ROS: as noted in HPI Allergy and Immunology ROS: negative for - hives or itchy/watery eyes Hematological and Lymphatic ROS: negative for - bleeding problems, bruising or swollen lymph nodes Endocrine ROS: negative for - galactorrhea, hair pattern changes, polydipsia/polyuria or temperature  intolerance Respiratory ROS: negative for - cough, hemoptysis, shortness of breath or wheezing Cardiovascular ROS: negative for - chest pain, dyspnea on exertion, edema or irregular heartbeat Gastrointestinal ROS: negative for - abdominal pain, diarrhea, hematemesis, nausea/vomiting or stool incontinence Genito-Urinary ROS: negative for - dysuria, hematuria, incontinence or urinary frequency/urgency Musculoskeletal ROS: negative for - joint swelling or muscular weakness Neurological ROS: as noted in HPI Dermatological ROS: negative for rash and skin lesion changes   Physical Examination: Blood pressure (!) 152/101, pulse 72, temperature 98.2 F (36.8 C), resp. rate 20, height 5' 3 (1.6 m), weight 83.9 kg, SpO2 96%.  HEENT-  Normocephalic, no lesions, without obvious abnormality.  Normal external eye and conjunctiva.  Normal external ears. Normal external nose, mucus membranes and septum. Cardiovascular- Single S1, S2 Lungs- CTA Abdomen- soft, non-tender; bowel sounds normal; no masses,  no organomegaly Extremities- no edema Musculoskeletal-no joint tenderness, deformity or swelling Skin-warm and dry, no hyperpigmentation, vitiligo, or suspicious lesions  Neurological Examination   Mental Status: Alert, oriented, thought content appropriate.  Speech fluent without evidence of aphasia.  Able to follow 3 step commands without difficulty. Cranial Nerves: II: Visual fields grossly normal, pupils equal, round, reactive to light and accommodation III,IV, VI: ptosis not present, extra-ocular motions intact bilaterally V,VII: left facial droop, decreased furrowing of the left forehead, decreased sensation on the forehead and cheek on the left VIII: hearing normal bilaterally XI: bilateral shoulder shrug XII: midline tongue extension Motor: Right : Upper extremity   5/5    Left:     Upper extremity   5/5  Lower extremity   5/5     Lower extremity   5/5 Tone and bulk:normal tone throughout;  no atrophy noted Sensory: Pinprick and light touch intact throughout, bilaterally Deep Tendon Reflexes: Symmetric throughout Plantars: Right: downgoing   Left: downgoing Cerebellar: normal finger-to-nose and normal heel-to-shin testing bilaterally Gait: not tested due to safety concerns      Laboratory Studies:  Basic Metabolic Panel: Recent Labs  Lab 07/26/24 1103  NA 137  K 4.1  CL 98  CO2 25  GLUCOSE 153*  BUN 17  CREATININE 1.03  CALCIUM  9.3    Liver Function Tests: Recent Labs  Lab 07/26/24 1103  AST 23  ALT 38  ALKPHOS 96  BILITOT 0.6  PROT 7.6  ALBUMIN 4.6   No results for input(s): LIPASE, AMYLASE in the last 168 hours. No results for input(s): AMMONIA in the last 168 hours.  CBC: Recent Labs  Lab 07/26/24 1103  WBC 12.2*  NEUTROABS 9.4*  HGB 16.5  HCT 46.3  MCV 83.7  PLT 392    Cardiac Enzymes: No results for input(s): CKTOTAL, CKMB, CKMBINDEX, TROPONINI in the last 168 hours.  BNP: Invalid input(s): POCBNP  CBG: No results for input(s): GLUCAP in the last 168 hours.  Microbiology: Results for orders placed or performed during the hospital encounter of 07/25/20  Resp Panel by RT-PCR (Flu A&B, Covid) Nasopharyngeal Swab     Status: None   Collection Time: 07/25/20 10:21 AM   Specimen: Nasopharyngeal Swab; Nasopharyngeal(NP) swabs in vial transport  medium  Result Value Ref Range Status   SARS Coronavirus 2 by RT PCR NEGATIVE NEGATIVE Final    Comment: (NOTE) SARS-CoV-2 target nucleic acids are NOT DETECTED.  The SARS-CoV-2 RNA is generally detectable in upper respiratory specimens during the acute phase of infection. The lowest concentration of SARS-CoV-2 viral copies this assay can detect is 138 copies/mL. A negative result does not preclude SARS-Cov-2 infection and should not be used as the sole basis for treatment or other patient management decisions. A negative result may occur with  improper specimen  collection/handling, submission of specimen other than nasopharyngeal swab, presence of viral mutation(s) within the areas targeted by this assay, and inadequate number of viral copies(<138 copies/mL). A negative result must be combined with clinical observations, patient history, and epidemiological information. The expected result is Negative.  Fact Sheet for Patients:  bloggercourse.com  Fact Sheet for Healthcare Providers:  seriousbroker.it  This test is no t yet approved or cleared by the [place]  FDA and  has been authorized for detection and/or diagnosis of SARS-CoV-2 by FDA under an Emergency Use Authorization (EUA). This EUA will remain  in effect (meaning this test can be used) for the duration of the COVID-19 declaration under Section 564(b)(1) of the Act, 21 U.S.C.section 360bbb-3(b)(1), unless the authorization is terminated  or revoked sooner.       Influenza A by PCR NEGATIVE NEGATIVE Final   Influenza B by PCR NEGATIVE NEGATIVE Final    Comment: (NOTE) The Xpert Xpress SARS-CoV-2/FLU/RSV plus assay is intended as an aid in the diagnosis of influenza from Nasopharyngeal swab specimens and should not be used as a sole basis for treatment. Nasal washings and aspirates are unacceptable for Xpert Xpress SARS-CoV-2/FLU/RSV testing.  Fact Sheet for Patients: bloggercourse.com  Fact Sheet for Healthcare Providers: seriousbroker.it  This test is not yet approved or cleared by the [place]  FDA and has been authorized for detection and/or diagnosis of SARS-CoV-2 by FDA under an Emergency Use Authorization (EUA). This EUA will remain in effect (meaning this test can be used) for the duration of the COVID-19 declaration under Section 564(b)(1) of the Act, 21 U.S.C. section 360bbb-3(b)(1), unless the authorization is terminated or revoked.  Performed at [facility], [address] [place], [place] [address]     Coagulation Studies: Recent Labs    07/26/24 1103  LABPROT 12.6  INR 0.9    Urinalysis: No results for input(s): COLORURINE, LABSPEC, PHURINE, GLUCOSEU, HGBUR, BILIRUBINUR, KETONESUR, PROTEINUR, UROBILINOGEN, NITRITE, LEUKOCYTESUR in the last 168 hours.  Invalid input(s): APPERANCEUR  Lipid Panel: No results found for: CHOL, TRIG, HDL, CHOLHDL, VLDL, LDLCALC  HgbA1C: No results found for: HGBA1C  Urine Drug Screen:  No results found for: LABOPIA, COCAINSCRNUR, LABBENZ, AMPHETMU, THCU, LABBARB  Alcohol Level:  Recent Labs  Lab 07/26/24 1103  ETH <15    Other results: EKG: NSR at 76 bpm.  Imaging: MR BRAIN WO CONTRAST Result Date: 07/26/2024 EXAM: MRI BRAIN WITHOUT CONTRAST 07/26/2024 04:02:31 PM TECHNIQUE: Multiplanar multisequence MRI of the head/brain was performed without the administration of intravenous contrast. COMPARISON: Head CT and CTA 07/26/2024. CLINICAL HISTORY: L facial weakness/numbness. FINDINGS: BRAIN AND VENTRICLES: There is no evidence of an acute infarct, intracranial hemorrhage, mass, midline shift, hydrocephalus, or extra-axial fluid collection. Cerebral volume is normal. Patchy T2 hyperintensities in the cerebral white matter bilaterally are moderately advanced for age and nonspecific but compatible with chronic small vessel ischemic disease. An abnormal vein over the posterior left cerebral convexity was more  fully evaluated on today's earlier CTA. Major intracranial arterial flow voids are preserved. ORBITS: No significant abnormality. SINUSES AND MASTOIDS: No significant abnormality. BONES AND SOFT TISSUES: Normal marrow signal. No soft tissue abnormality. IMPRESSION: 1. No acute intracranial abnormality. 2. Moderate chronic small vessel ischemic disease. Electronically signed by: Dasie Hamburg MD 07/26/2024 04:19 PM EST RP Workstation: [id number]    CT ANGIO HEAD NECK W WO CM Result Date: 07/26/2024 EXAM: CTA HEAD AND NECK WITH AND WITHOUT 07/26/2024 02:35:28 PM TECHNIQUE: CTA of the head and neck was performed with and without the administration of 75 mL iohexol  (OMNIPAQUE ) 350 MG/ML injection. Multiplanar 2D and/or 3D reformatted images are provided for review. Automated exposure control, iterative reconstruction, and/or weight based adjustment of the mA/kV was utilized to reduce the radiation dose to as low as reasonably achievable. Stenosis of the internal carotid arteries measured using NASCET criteria. COMPARISON: None available CLINICAL HISTORY: L facial numbness/weakness FINDINGS: CTA NECK: AORTIC ARCH AND ARCH VESSELS: No dissection or arterial injury. No significant stenosis of the brachiocephalic or subclavian arteries. CERVICAL CAROTID ARTERIES: Minimal calcified and soft plaque in the left carotid bulb. No dissection, arterial injury, or hemodynamically significant stenosis by NASCET criteria. CERVICAL VERTEBRAL ARTERIES: Minimally dominant left vertebral artery. No dissection, arterial injury, or significant stenosis. LUNGS AND MEDIASTINUM: Unremarkable. SOFT TISSUES: No acute abnormality. BONES: Mild cervical spondylosis. CTA HEAD: ANTERIOR CIRCULATION: The intracranial internal carotid arteries are patent with mild stenosis of the left carotid segment and no significant stenosis on the right. ACAs and MCAs are patent without evidence of a proximal branch occlusion or significant proximal stenosis. There are small right and diminutive or absent left posterior communicating arteries. No aneurysm. POSTERIOR CIRCULATION: The intracranial vertebral arteries are widely patent to the basilar. Patent left PICA, bilateral AICA, and bilateral SCA origins are visualized. The basilar artery is widely patent. Both PCAs are patent without evidence of a significant proximal stenosis on the left. There is a moderate proximal right P2 stenosis. No aneurysm.  OTHER: A 10 x 6 mm ovoid, peripherally calcified structure over the left frontal convexity towards the vertex appears contiguous with and is isoenhancing to a cortical vein and more resembles an isolated venous varix rather than meningioma. No dural venous sinus thrombosis on this non-dedicated study. IMPRESSION: 1. No large vessel occlusion or significant stenosis in the neck. 2. Mild intracranial atherosclerosis including mild stenosis of the left cavernous ICA and moderate right P2 stenosis. 3. 10 x 6 mm enhancing structure over the left frontal convexity most resembling an isolated venous varix. Electronically signed by: Dasie Hamburg MD 07/26/2024 02:56 PM EST RP Workstation: [id number]   CT HEAD WO CONTRAST Result Date: 07/26/2024 EXAM: CT HEAD WITHOUT CONTRAST 07/26/2024 12:38:41 PM TECHNIQUE: CT of the head was performed without the administration of intravenous contrast. Automated exposure control, iterative reconstruction, and/or weight based adjustment of the mA/kV was utilized to reduce the radiation dose to as low as reasonably achievable. COMPARISON: None available. CLINICAL HISTORY: Neuro deficit, acute, stroke suspected Neuro deficit, acute, stroke suspected Neuro deficit, acute, stroke suspected Neuro deficit, acute, stroke suspected Neuro deficit, acute, stroke suspected FINDINGS: BRAIN AND VENTRICLES: No acute hemorrhage. No evidence of acute infarct. No hydrocephalus. Calcified dural-based extra-axial mass along the left frontal convexity measures 10 x 7 mm, compatible with meningioma. Mild periventricular white matter hypoattenuation, likely sequela of chronic small vessel ischemic disease. No mass effect or midline shift. ORBITS: No acute abnormality. SINUSES: No acute abnormality. SOFT TISSUES AND SKULL: No acute soft  tissue abnormality. No skull fracture. IMPRESSION: 1. No acute intracranial abnormality. 2. Mild periventricular white matter hypoattenuation, likely sequela of chronic small  vessel ischemic disease. 3. Calcified dural-based extra-axial mass along the left frontal convexity measuring 10 x 7 mm, compatible with meningioma. D Electronically signed by: Evalene Coho MD 07/26/2024 12:53 PM EST RP Workstation: [id number]    Assessment: 59 y.o. male with  a history of a clotting disorder (prescribed Eliquis  of which he has not been on for the past year, taking ASA) , HLD and HTN who presents with complaint of left facial droop.  Symptoms and exam consistent with Bell's Palsy despite his risk factors.  Head CT personally reviewed and reveals no evidence of hemorrhage.  CTA of the head and neck show no evidence of LVO.  MRI of the brain personally reviewed and reveals no evidence of acute infarct.    Stroke Risk Factors - hyperlipidemia and hypertension  Plan: 1. Would treat Bell's palsy with steroids and antiviral 2. Would start Eliquis  based on recommendations from vascular and/or hematology.  No indication from a strictly neuro standpoint  Case discussed with Dr. Jacolyn Sonny Hock, MD Neurology  07/26/2024, 4:54 PM          [1]  Allergies Allergen Reactions   Statins Other (See Comments)   Pravastatin  Sodium     Note: pain in side, causing issues with breathing

## 2024-07-26 NOTE — ED Provider Notes (Signed)
----------------------------------------- °  5:05 PM on 07/26/2024 -----------------------------------------  I took over care of this patient from Dr. Rexford.  The MRI is negative for acute findings.  Based on the MRI and discussion with Dr. Germaine from neurology, the presentation is consistent with Bell's palsy.  I have prescribed the patient prednisone .  Dr. Germaine deferred to the patient's other providers for management of his anticoagulation.  The patient reports that he was only stopped Eliquis  because his insurance ended.  He was not formally discontinued on it.  He most recently was on 5 mg twice daily.  Therefore I will restart him on this dose until he can follow-up with his outpatient providers.  I counseled the patient on the results of the workup and plan of care.  I gave strict return precautions, and he expressed understanding.   Jacolyn Pae, MD 07/26/24 [phone]

## 2024-07-26 NOTE — ED Notes (Signed)
 Pt given DC instructions. Pt educated on BP medication. Pt verbalized understanding of medication and follow up care. Pt ambulatory from ED without difficulty.

## 2024-07-26 NOTE — ED Triage Notes (Signed)
 Pt to ED for left sided facial numbness noticed when woke up at 0600 this morning. LKN 1200 midnight. Reports drinking dripping out of mouth when trying to drink Reports posterior head pain

## 2024-07-26 NOTE — ED Notes (Signed)
 Patient transported to CT

## 2024-07-29 ENCOUNTER — Telehealth: Payer: Self-pay | Admitting: Emergency Medicine

## 2024-07-29 MED ORDER — SYSTANE 0.4-0.3 % OP SOLN: 1.0000 [drp] | OPHTHALMIC | 0 refills | Status: AC | PRN

## 2024-07-29 MED ORDER — SYSTANE 0.4-0.3 % OP SOLN: 1.0000 [drp] | OPHTHALMIC | 0 refills | Status: DC | PRN

## 2024-07-29 NOTE — Telephone Encounter (Signed)
 SABRA

## 2024-08-04 ENCOUNTER — Ambulatory Visit: Admitting: Cardiology

## 2024-08-04 ENCOUNTER — Encounter: Payer: Self-pay | Admitting: Cardiology

## 2024-08-04 VITALS — BP 148/90 | HR 77 | Ht 64.0 in | Wt 198.6 lb

## 2024-08-04 DIAGNOSIS — E669 Obesity, unspecified: Secondary | ICD-10-CM

## 2024-08-04 DIAGNOSIS — G510 Bell's palsy: Secondary | ICD-10-CM | POA: Diagnosis not present

## 2024-08-04 DIAGNOSIS — I742 Embolism and thrombosis of arteries of the upper extremities: Secondary | ICD-10-CM | POA: Diagnosis not present

## 2024-08-04 DIAGNOSIS — I10 Essential (primary) hypertension: Secondary | ICD-10-CM | POA: Diagnosis not present

## 2024-08-04 MED ORDER — PREDNISONE 10 MG PO TABS: ORAL_TABLET | ORAL | 0 refills | Status: AC

## 2024-08-04 MED ORDER — VALACYCLOVIR HCL 1 G PO TABS: 1000.0000 mg | ORAL_TABLET | Freq: Three times a day (TID) | ORAL | 0 refills | Status: AC

## 2024-08-04 MED ORDER — GABAPENTIN 100 MG PO CAPS: 100.0000 mg | ORAL_CAPSULE | Freq: Every day | ORAL | 0 refills | Status: AC

## 2024-08-04 MED ORDER — AMLODIPINE BESYLATE 5 MG PO TABS: 5.0000 mg | ORAL_TABLET | Freq: Every day | ORAL | 1 refills | Status: AC

## 2024-08-04 NOTE — Progress Notes (Addendum)
 "  Established Patient Office Visit  Subjective:  Patient ID: Jesse Chapman, male    DOB: 21-Aug-1965  Age: 59 y.o. MRN: [id number]  Chief Complaint  Patient presents with   Establish Care    NPE. Needs to talk about high BP and having ear pain face pain on left side. Wants to know if he should go to the hospital.    Patient in office for overdue follow up. Patient last seen in 2023. Daughter in room for visit and acts as interpretor. Patient has a few complaints today. Blood pressure is elevated. Has not been taking losartan , states he stopped it due to feeling dizziness, not feeling well while talking it. Will add low dose amlodipine . Will reassess blood pressure in 4 weeks.  Patient went to the ED on 07/26/24 with complaints of left sided facial numbness, patient diagnosed with bells palsy, discharged on prednisone  only. Patient comes in today complaining of nerve pain left side of face, will send in Gabapentin  100 mg at bedtime. Patient using OTC eyedrops. Will send in another round of prednisone  along with valacyclovir . Will reassess in 4 weeks.  Patient was off his Eliquis  for a period of time due to insurance, restarted at ED on 07/26/24.    No other concerns at this time.   Past Medical History:  Diagnosis Date   Clotting disorder    Hyperlipidemia    Hypertension     Past Surgical History:  Procedure Laterality Date   COLONOSCOPY WITH PROPOFOL  N/A 02/02/2021   Procedure: COLONOSCOPY WITH PROPOFOL ;  Surgeon: Dessa Reyes ORN, MD;  Location: [facility] ENDOSCOPY;  Service: Gastroenterology;  Laterality: N/A;  ELIQUIS    GALLBLADDER SURGERY     UPPER EXTREMITY ANGIOGRAPHY Right 07/25/2020   Procedure: Upper Extremity Angiography;  Surgeon: Jama Cordella MATSU, MD;  Location: [facility] INVASIVE CV LAB;  Service: Cardiovascular;  Laterality: Right;    Social History   Socioeconomic History   Marital status: Single    Spouse name: Not on file   Number of children: Not on file    Years of education: Not on file   Highest education level: Not on file  Occupational History   Not on file  Tobacco Use   Smoking status: Former    Types: Cigarettes   Smokeless tobacco: Never  Vaping Use   Vaping status: Never Used  Substance and Sexual Activity   Alcohol use: Not Currently   Drug use: Never   Sexual activity: Not on file  Other Topics Concern   Not on file  Social History Narrative   ** Merged History Encounter **       Social Drivers of Health   Tobacco Use: Medium Risk (08/04/2024)   Patient History    Smoking Tobacco Use: Former    Smokeless Tobacco Use: Never    Passive Exposure: Not on Actuary Strain: Not on file  Food Insecurity: Not on file  Transportation Needs: Not on file  Physical Activity: Not on file  Stress: Not on file  Social Connections: Not on file  Intimate Partner Violence: Not on file  Depression (PHQ2-9): Low Risk (08/04/2024)   Depression (PHQ2-9)    PHQ-2 Score: 2  Alcohol Screen: Not on file  Housing: Not on file  Utilities: Not on file  Health Literacy: Not on file    Family History  Problem Relation Age of Onset   Healthy Mother    Healthy Father     Allergies[1]  Show/hide medication list[2]  Review of Systems  Constitutional: Negative.   HENT: Negative.    Eyes: Negative.   Respiratory: Negative.  Negative for shortness of breath.   Cardiovascular: Negative.  Negative for chest pain.  Gastrointestinal: Negative.  Negative for abdominal pain, constipation and diarrhea.  Genitourinary: Negative.   Musculoskeletal:  Negative for joint pain and myalgias.  Skin: Negative.   Neurological: Negative.  Negative for dizziness and headaches.  Endo/Heme/Allergies: Negative.   All other systems reviewed and are negative.      Objective:   BP (!) 148/90 (BP Location: Left Arm, Patient Position: Sitting, Cuff Size: Large)   Pulse 77   Ht 5' 4 (1.626 m)   Wt 198 lb 9.6 oz (90.1 kg)   SpO2 95%    BMI 34.09 kg/m   Vitals:   08/04/24 1007 08/04/24 1034  BP: (!) 172/94 (!) 148/90  Pulse: 77   Height: 5' 4 (1.626 m)   Weight: 198 lb 9.6 oz (90.1 kg)   SpO2: 95%   BMI (Calculated): 34.07     Physical Exam Nursing note reviewed.  Constitutional:      Appearance: Normal appearance. He is normal weight.  HENT:     Head: Normocephalic and atraumatic.     Nose: Nose normal.     Mouth/Throat:     Mouth: Mucous membranes are moist.     Pharynx: Oropharynx is clear.  Eyes:     Extraocular Movements: Extraocular movements intact.     Conjunctiva/sclera: Conjunctivae normal.     Pupils: Pupils are equal, round, and reactive to light.  Cardiovascular:     Rate and Rhythm: Normal rate and regular rhythm.     Pulses: Normal pulses.     Heart sounds: Normal heart sounds.  Pulmonary:     Effort: Pulmonary effort is normal.     Breath sounds: Normal breath sounds.  Abdominal:     General: Abdomen is flat. Bowel sounds are normal.     Palpations: Abdomen is soft.  Musculoskeletal:        General: Normal range of motion.     Cervical back: Normal range of motion.  Skin:    General: Skin is warm and dry.  Neurological:     General: No focal deficit present.     Mental Status: He is alert and oriented to person, place, and time.     Cranial Nerves: Cranial nerve deficit present.     Comments: Left facial bells palsy  Psychiatric:        Mood and Affect: Mood normal.        Behavior: Behavior normal.        Thought Content: Thought content normal.        Judgment: Judgment normal.      No results found for any visits on 08/04/24.  Recent Results (from the past 2160 hours)  Protime-INR     Status: None   Collection Time: 07/26/24 11:03 AM  Result Value Ref Range   Prothrombin Time 12.6 11.4 - 15.2 seconds   INR 0.9 0.8 - 1.2    Comment: (NOTE) INR goal varies based on device and disease states. Performed at [facility], [address]  [place], [place] [address]   APTT     Status: None   Collection Time: 07/26/24 11:03 AM  Result Value Ref Range   aPTT 28 24 - 36 seconds    Comment: Performed at [facility], [address] [place], [place] [address]  CBC  Status: Abnormal   Collection Time: 07/26/24 11:03 AM  Result Value Ref Range   WBC 12.2 (H) 4.0 - 10.5 K/uL   RBC 5.53 4.22 - 5.81 MIL/uL   Hemoglobin 16.5 13.0 - 17.0 g/dL   HCT 53.6 60.9 - 47.9 %   MCV 83.7 80.0 - 100.0 fL   MCH 29.8 26.0 - 34.0 pg   MCHC 35.6 30.0 - 36.0 g/dL   RDW 88.2 88.4 - 84.4 %   Platelets 392 150 - 400 K/uL   nRBC 0.0 0.0 - 0.2 %    Comment: Performed at [facility], [address] [place], [place] [address]  Differential     Status: Abnormal   Collection Time: 07/26/24 11:03 AM  Result Value Ref Range   Neutrophils Relative % 75 %   Neutro Abs 9.4 (H) 1.7 - 7.7 K/uL   Lymphocytes Relative 18 %   Lymphs Abs 2.2 0.7 - 4.0 K/uL   Monocytes Relative 4 %   Monocytes Absolute 0.5 0.1 - 1.0 K/uL   Eosinophils Relative 1 %   Eosinophils Absolute 0.1 0.0 - 0.5 K/uL   Basophils Relative 1 %   Basophils Absolute 0.1 0.0 - 0.1 K/uL   Immature Granulocytes 1 %   Abs Immature Granulocytes 0.07 0.00 - 0.07 K/uL    Comment: Performed at [facility], [address] [place], [place] [address]  Comprehensive metabolic panel     Status: Abnormal   Collection Time: 07/26/24 11:03 AM  Result Value Ref Range   Sodium 137 135 - 145 mmol/L   Potassium 4.1 3.5 - 5.1 mmol/L   Chloride 98 98 - 111 mmol/L   CO2 25 22 - 32 mmol/L   Glucose, Bld 153 (H) 70 - 99 mg/dL    Comment: Glucose reference range applies only to samples taken after fasting for at least 8 hours.   BUN 17 6 - 20 mg/dL   Creatinine, Ser 8.96 0.61 - 1.24 mg/dL   Calcium  9.3 8.9 - 10.3 mg/dL   Total Protein 7.6 6.5 - 8.1 g/dL   Albumin 4.6 3.5 - 5.0 g/dL   AST 23 15 - 41 U/L   ALT 38 0 - 44 U/L   Alkaline Phosphatase 96 38 - 126 U/L   Total  Bilirubin 0.6 0.0 - 1.2 mg/dL   GFR, Estimated >39 >39 mL/min    Comment: (NOTE) Calculated using the CKD-EPI Creatinine Equation (2021)    Anion gap 13 5 - 15    Comment: Performed at [facility], [address] [place], [place] [address]  Ethanol     Status: None   Collection Time: 07/26/24 11:03 AM  Result Value Ref Range   Alcohol, Ethyl (B) <15 <15 mg/dL    Comment: (NOTE) For medical purposes only. 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   Total time spent: 25 minutes. This time includes review of previous notes and results and patient face to face interaction during today's visit.    Jeoffrey Pollen, NP  08/04/2024   This document may have been prepared by [facility] Voice Recognition software and as such may include unintentional dictation errors.      [1]  Allergies Allergen Reactions   Statins Other (See Comments)   Pravastatin  Sodium     Note: pain in side, causing issues with breathing  [2]  Outpatient Medications Prior to Visit  Medication Sig   apixaban  (ELIQUIS ) 5 MG TABS tablet Take 1 tablet (5 mg total) by mouth 2 (two) times daily.   Polyethyl Glycol-Propyl Glycol (SYSTANE) 0.4-0.3 % SOLN Apply 1 drop to eye every 4 (four) hours as needed for up to 10 days (dry eye).    tobramycin -dexamethasone  (TOBRADEX ) ophthalmic solution Place 2 drops into the left eye every 6 (six) hours.   [DISCONTINUED] clotrimazole -betamethasone  (LOTRISONE ) cream Apply 1 application topically 2 (two) times daily. (Patient not taking: Reported on 08/04/2024)   [DISCONTINUED] losartan  (COZAAR ) 25 MG tablet Take 1 tablet (25 mg total) by mouth daily. (Patient not taking: Reported on 08/04/2024)   [DISCONTINUED] terbinafine  (LAMISIL ) 250 MG tablet Take 1 tablet (250 mg total) by mouth daily. (Patient not taking: Reported on 08/04/2024)   [DISCONTINUED] terbinafine  (LAMISIL ) 250 MG tablet Take 1 tablet (250 mg total) by mouth daily. (Patient not taking: Reported on 08/04/2024)   [DISCONTINUED] terbinafine  (LAMISIL ) 250 MG tablet Take 1 tablet (250 mg total) by mouth daily. (Patient not taking: Reported on 08/04/2024)   [DISCONTINUED] valACYclovir  (VALTREX ) 1000 MG tablet Take 1,000 mg by mouth 3 (three) times daily. (Patient not taking: Reported on 08/04/2024)   No facility-administered medications prior to visit.   "

## 2024-08-09 ENCOUNTER — Ambulatory Visit: Admitting: Family

## 2024-09-01 ENCOUNTER — Ambulatory Visit: Admitting: Family
